# Patient Record
Sex: Male | Born: 1955 | Race: White | Hispanic: No | Marital: Married | Smoking: Former smoker
Health system: Southern US, Community
[De-identification: ages and names within clinical notes are randomized; demographics above are authoritative.]

## PROBLEM LIST (undated history)

## (undated) DIAGNOSIS — N400 Enlarged prostate without lower urinary tract symptoms: Secondary | ICD-10-CM

## (undated) DIAGNOSIS — M1990 Unspecified osteoarthritis, unspecified site: Secondary | ICD-10-CM

## (undated) DIAGNOSIS — I10 Essential (primary) hypertension: Secondary | ICD-10-CM

## (undated) HISTORY — DX: Essential (primary) hypertension: I10

## (undated) HISTORY — DX: Benign prostatic hyperplasia without lower urinary tract symptoms: N40.0

## (undated) HISTORY — DX: Unspecified osteoarthritis, unspecified site: M19.90

---

## 1989-10-16 HISTORY — PX: BACK SURGERY: SHX140

## 2013-10-16 HISTORY — PX: COLONOSCOPY: SHX174

## 2013-10-16 HISTORY — PX: POLYPECTOMY: SHX149

## 2019-10-13 ENCOUNTER — Other Ambulatory Visit: Payer: Self-pay

## 2019-10-13 NOTE — Progress Notes (Signed)
New Patient Office Visit  Assessment & Plan:  1. Well adult exam - Preventive care education provided.  Patient declined influenza, pneumonia, and shingles vaccines.  Also declines HIV screening.  Tdap is up-to-date.  Patient reports he is due for a colonoscopy as he was told to return in 3 years and it has been around 7 years.  States if he remembers correctly they removed 21 polyps.  He does not want me to place a referral at this time but to wait until I get his records. - CMP14+EGFR - Lipid Panel - PSA, total and free - HCV Antibody RFX to Quant PCR - HgbA1C  2. Essential hypertension - Blood pressure is elevated today.  Patient reports he has always been told to take 30 mg of lisinopril but only takes 20 mg of lisinopril as he does not feel like taking the time to cut one in half.  Patient also reports he has a tickle in his throat that he has had for a very long time.  His mother had the same reaction and when they took her off lisinopril it resolved.  He is not actually coughing.  I am going to switch him from lisinopril to losartan 50 mg once daily.  Encourage patient to keep a log of his blood pressure at home and bring it with him to his next appointment. - losartan (COZAAR) 50 MG tablet; Take 1 tablet (50 mg total) by mouth daily.  Dispense: 30 tablet; Refill: 2 - CMP14+EGFR - Lipid Panel  3. Chronic left shoulder pain - Exercises provided for his shoulder.  Encourage use of muscle rubs and Tylenol/NSAIDs.  Patient does not like to take medication.  4. Prediabetes - HgbA1C  5. Urinary frequency - PSA, total and free  6. Prostate cancer screening - PSA, total and free  7. Encounter for hepatitis C screening test for low risk patient - HCV Antibody RFX to Quant PCR   Follow-up: Return in about 6 weeks (around 11/25/2019) for HTN, shoulder pain.   Hendricks Limes, MSN, APRN, FNP-C [facility]  Subjective:  Patient ID: Dakota Cooke, male    DOB:  [birth year]  Age: 63 y.o. MRN: [id number]  Patient Care Team: Loman Brooklyn, FNP as PCP - General (Family Medicine)  CC:  Chief Complaint  Patient presents with  . New Patient (Initial Visit)    [place]  . Establish Care  . Shoulder Pain    Left - from a fall x 4 months ago    HPI Dakota Cooke presents to establish care. Patient is transferring from [place]. A record release has been signed but they have not yet been received.   Hypertension: Patient here for follow-up of elevated blood pressure. He is not exercising and is adherent to low salt diet.  Blood pressure is not well controlled at home. Cardiac symptoms none. Patient denies chest pain, dyspnea, irregular heart beat, near-syncope, palpitations and syncope.  Cardiovascular risk factors: advanced age (older than 16 for men, 62 for women), hypertension, male gender, obesity (BMI >= 30 kg/m2) and sedentary lifestyle. Use of agents associated with hypertension: none. History of target organ damage: none.  Shoulder Pain: Patient complaints of left shoulder pain. This is evaluated as a personal injury. The pain is described as sharp.  The onset of the pain was 4 months ago.  The pain occurs at night/ wakes from sleep at night.  Location is anterior. No history of dislocation. Symptoms are aggravated  by lifting or sleeping on unaffected side. Symptoms are diminished by  avoiding the painful activities and changing positions.  Heat makes the pain worse. He doesn't leave ice on long enough.      Review of Systems  Constitutional: Negative for chills, fever, malaise/fatigue and weight loss.  HENT: Positive for hearing loss. Negative for congestion, ear discharge, ear pain, nosebleeds, sinus pain, sore throat and tinnitus.   Eyes: Negative for blurred vision, double vision, pain, discharge and redness.  Respiratory: Negative for cough, shortness of breath and wheezing.   Cardiovascular: Negative for chest pain,  palpitations and leg swelling.  Gastrointestinal: Negative for abdominal pain, constipation, diarrhea, heartburn, nausea and vomiting.  Genitourinary: Negative for dysuria, frequency and urgency.       Denies trouble initiating a urine stream, weak stream, split stream, and dribbling.  Musculoskeletal: Negative for myalgias.  Skin: Negative for rash.  Neurological: Negative for dizziness, seizures, weakness and headaches.  Psychiatric/Behavioral: Negative for depression, substance abuse and suicidal ideas. The patient is not nervous/anxious.     Current Outpatient Medications:  .  aspirin 81 MG EC tablet, Take 81 mg by mouth daily. Swallow whole., Disp: , Rfl:  .  atorvastatin (LIPITOR) 10 MG tablet, Take 1 tablet (10 mg total) by mouth daily at 6 PM., Disp: 30 tablet, Rfl: 2 .  losartan (COZAAR) 50 MG tablet, Take 1 tablet (50 mg total) by mouth daily., Disp: 30 tablet, Rfl: 2  No Known Allergies  Past Medical History:  Diagnosis Date  . Hyperlipidemia 10/15/2019  . Hypertension     Past Surgical History:  Procedure Laterality Date  . BACK SURGERY  1991    Family History  Problem Relation Age of Onset  . Hypertension Mother   . Kidney cancer Mother   . Parkinson's disease Father   . Breast cancer Sister 37  . Aneurysm Maternal Grandmother        brain  . Hypertension Son     Social History   Socioeconomic History  . Marital status: Married    Spouse name: Not on file  . Number of children: Not on file  . Years of education: Not on file  . Highest education level: Not on file  Occupational History  . Not on file  Tobacco Use  . Smoking status: Former Smoker    Types: Cigarettes    Quit date: 1989    Years since quitting: 32.0  . Smokeless tobacco: Never Used  Substance and Sexual Activity  . Alcohol use: Yes    Comment: occ  . Drug use: Never  . Sexual activity: Not on file  Other Topics Concern  . Not on file  Social History Narrative  . Not on file    Social Determinants of Health   Financial Resource Strain:   . Difficulty of Paying Living Expenses: Not on file  Food Insecurity:   . Worried About Charity fundraiser in the Last Year: Not on file  . Ran Out of Food in the Last Year: Not on file  Transportation Needs:   . Lack of Transportation (Medical): Not on file  . Lack of Transportation (Non-Medical): Not on file  Physical Activity:   . Days of Exercise per Week: Not on file  . Minutes of Exercise per Session: Not on file  Stress:   . Feeling of Stress : Not on file  Social Connections:   . Frequency of Communication with Friends and Family: Not on file  . Frequency  of Social Gatherings with Friends and Family: Not on file  . Attends Religious Services: Not on file  . Active Member of Clubs or Organizations: Not on file  . Attends Archivist Meetings: Not on file  . Marital Status: Not on file  Intimate Partner Violence:   . Fear of Current or Ex-Partner: Not on file  . Emotionally Abused: Not on file  . Physically Abused: Not on file  . Sexually Abused: Not on file    Objective:   Today's Vitals: BP (!) 164/98   Pulse 67   Temp 98.6 F (37 C) (Temporal)   Ht '5\' 8"'$  (1.727 m)   Wt 204 lb 6.4 oz (92.7 kg)   SpO2 97%   BMI 31.08 kg/m   Physical Exam Vitals reviewed.  Constitutional:      General: He is not in acute distress.    Appearance: Normal appearance. He is obese. He is not ill-appearing, toxic-appearing or diaphoretic.  HENT:     Head: Normocephalic and atraumatic.     Right Ear: Tympanic membrane, ear canal and external ear normal. There is no impacted cerumen.     Left Ear: Tympanic membrane, ear canal and external ear normal. There is no impacted cerumen.     Nose: Nose normal. No congestion or rhinorrhea.     Mouth/Throat:     Mouth: Mucous membranes are moist.     Pharynx: Oropharynx is clear. No oropharyngeal exudate or posterior oropharyngeal erythema.  Eyes:     General: No  scleral icterus.       Right eye: No discharge.        Left eye: No discharge.     Conjunctiva/sclera: Conjunctivae normal.     Pupils: Pupils are equal, round, and reactive to light.  Cardiovascular:     Rate and Rhythm: Normal rate and regular rhythm.     Heart sounds: Normal heart sounds. No murmur. No friction rub. No gallop.   Pulmonary:     Effort: Pulmonary effort is normal. No respiratory distress.     Breath sounds: Normal breath sounds. No stridor. No wheezing, rhonchi or rales.  Abdominal:     General: Abdomen is flat. Bowel sounds are normal. There is no distension.     Palpations: Abdomen is soft. There is no mass.     Tenderness: There is no abdominal tenderness. There is no guarding or rebound.     Hernia: No hernia is present.  Musculoskeletal:     Left shoulder: Tenderness present. No swelling, deformity, effusion, laceration, bony tenderness or crepitus. Decreased range of motion. Normal strength. Normal pulse.     Cervical back: Normal range of motion and neck supple. No rigidity. No muscular tenderness.     Right lower leg: No edema.     Left lower leg: No edema.     Comments: Normal empty can test and cross arm test. Pain/LROM with Apley's scratch test.  Lymphadenopathy:     Cervical: No cervical adenopathy.  Skin:    General: Skin is warm and dry.     Capillary Refill: Capillary refill takes less than 2 seconds.  Neurological:     General: No focal deficit present.     Mental Status: He is alert and oriented to person, place, and time. Mental status is at baseline.  Psychiatric:        Mood and Affect: Mood normal.        Behavior: Behavior normal.  Thought Content: Thought content normal.        Judgment: Judgment normal.

## 2019-10-14 ENCOUNTER — Encounter: Payer: Self-pay | Admitting: Family Medicine

## 2019-10-14 ENCOUNTER — Ambulatory Visit (INDEPENDENT_AMBULATORY_CARE_PROVIDER_SITE_OTHER): Payer: Medicare Other | Admitting: Family Medicine

## 2019-10-14 VITALS — BP 164/98 | HR 67 | Temp 98.6°F | Ht 68.0 in | Wt 204.4 lb

## 2019-10-14 DIAGNOSIS — Z0000 Encounter for general adult medical examination without abnormal findings: Secondary | ICD-10-CM

## 2019-10-14 DIAGNOSIS — R350 Frequency of micturition: Secondary | ICD-10-CM | POA: Diagnosis not present

## 2019-10-14 DIAGNOSIS — Z029 Encounter for administrative examinations, unspecified: Secondary | ICD-10-CM

## 2019-10-14 DIAGNOSIS — Z125 Encounter for screening for malignant neoplasm of prostate: Secondary | ICD-10-CM

## 2019-10-14 DIAGNOSIS — I10 Essential (primary) hypertension: Secondary | ICD-10-CM

## 2019-10-14 DIAGNOSIS — M25512 Pain in left shoulder: Secondary | ICD-10-CM

## 2019-10-14 DIAGNOSIS — R7303 Prediabetes: Secondary | ICD-10-CM

## 2019-10-14 DIAGNOSIS — G8929 Other chronic pain: Secondary | ICD-10-CM

## 2019-10-14 DIAGNOSIS — Z1159 Encounter for screening for other viral diseases: Secondary | ICD-10-CM

## 2019-10-14 LAB — BAYER DCA HB A1C WAIVED: HB A1C (BAYER DCA - WAIVED): 5.7 % (ref <7.0)

## 2019-10-14 MED ORDER — LOSARTAN POTASSIUM 50 MG PO TABS: 50.0000 mg | ORAL_TABLET | Freq: Every day | ORAL | 2 refills | Status: DC

## 2019-10-14 NOTE — Patient Instructions (Addendum)
Shoulder Range of Motion Exercises Shoulder range of motion (ROM) exercises are done to keep the shoulder moving freely or to increase movement. They are often recommended for people who have shoulder pain or stiffness or who are recovering from a shoulder surgery. Phase 1 exercises When you are able, do this exercise 1-2 times per day for 30-60 seconds in each direction, or as directed by your health care provider. Pendulum exercise To do this exercise while sitting: 1. Sit in a chair or at the edge of your bed with your feet flat on the floor. 2. Let your affected arm hang down in front of you over the edge of the bed or chair. 3. Relax your shoulder, arm, and hand. [place] your body so your arm gently swings in small circles. You can also use your unaffected arm to start the motion. 5. Repeat changing the direction of the circles, swinging your arm left and right, and swinging your arm forward and back. To do this exercise while standing: 1. Stand next to a sturdy chair or table, and hold on to it with your hand on your unaffected side. 2. Bend forward at the waist. 3. Bend your knees slightly. 4. Relax your shoulder, arm, and hand. 5. While keeping your shoulder relaxed, use body motion to swing your arm in small circles. 6. Repeat changing the direction of the circles, swinging your arm left and right, and swinging your arm forward and back. 7. Between exercises, stand up tall and take a short break to relax your lower back.  Phase 2 exercises Do these exercises 1-2 times per day or as told by your health care provider. Hold each stretch for 30 seconds, and repeat 3 times. Do the exercises with one or both arms as instructed by your health care provider. For these exercises, sit at a table with your hand and arm supported by the table. A chair that slides easily or has wheels can be helpful. External rotation 1. Turn your chair so that your affected side is nearest to the  table. 2. Place your forearm on the table to your side. Bend your elbow about 90 at the elbow (right angle) and place your hand palm facing down on the table. Your elbow should be about 6 inches away from your side. 3. Keeping your arm on the table, lean your body forward. Abduction 1. Turn your chair so that your affected side is nearest to the table. 2. Place your forearm and hand on the table so that your thumb points toward the ceiling and your arm is straight out to your side. 3. Slide your hand out to the side and away from you, using your unaffected arm to do the work. 4. To increase the stretch, you can slide your chair away from the table. Flexion: forward stretch 1. Sit facing the table. Place your hand and elbow on the table in front of you. 2. Slide your hand forward and away from you, using your unaffected arm to do the work. 3. To increase the stretch, you can slide your chair backward. Phase 3 exercises Do these exercises 1-2 times per day or as told by your health care provider. Hold each stretch for 30 seconds, and repeat 3 times. Do the exercises with one or both arms as instructed by your health care provider. Cross-body stretch: posterior capsule stretch 1. Lift your arm straight out in front of you. 2. Bend your arm 90 at the elbow (right angle) so your forearm  moves across your body. 3. Use your other arm to gently pull the elbow across your body, toward your other shoulder. Wall climbs 1. Stand with your affected arm extended out to the side with your hand resting on a door frame. 2. Slide your hand slowly up the door frame. 3. To increase the stretch, step through the door frame. Keep your body upright and do not lean. Wand exercises You will need a cane, a piece of PVC pipe, or a sturdy wooden dowel for wand exercises. Flexion To do this exercise while standing: 1. Hold the wand with both of your hands, palms down. 2. Using the other arm to help, lift your arms  up and over your head, if able. 3. Push upward with your other arm to gently increase the stretch. To do this exercise while lying down: 1. Lie on your back with your elbows resting on the floor and the wand in both your hands. Your hands will be palm down, or pointing toward your feet. 2. Lift your hands toward the ceiling, using your unaffected arm to help if needed. 3. Bring your arms overhead as able, using your unaffected arm to help if needed. Internal rotation 1. Stand while holding the wand behind you with both hands. Your unaffected arm should be extended above your head with the arm of the affected side extended behind you at the level of your waist. The wand should be pointing straight up and down as you hold it. 2. Slowly pull the wand up behind your back by straightening the elbow of your unaffected arm and bending the elbow of your affected arm. External rotation 1. Lie on your back with your affected upper arm supported on a small pillow or rolled towel. When you first do this exercise, keep your upper arm close to your body. Over time, bring your arm up to a 90 angle out to the side. 2. Hold the wand across your stomach and with both hands palm up. Your elbow on your affected side should be bent at a 90 angle. 3. Use your unaffected side to help push your forearm away from you and toward the floor. Keep your elbow on your affected side bent at a 90 angle. Contact a health care provider if you have:  New or increasing pain.  New numbness, tingling, weakness, or discoloration in your arm or hand. This information is not intended to replace advice given to you by your health care provider. Make sure you discuss any questions you have with your health care provider. Document Released: 07/01/2003 Document Revised: 11/14/2017 Document Reviewed: 11/14/2017 Elsevier Patient Education  2020 [employer] 65 Years and Older, Male Preventive care refers to lifestyle  choices and visits with your health care provider that can promote health and wellness. This includes:  A yearly physical exam. This is also called an annual well check.  Regular dental and eye exams.  Immunizations.  Screening for certain conditions.  Healthy lifestyle choices, such as diet and exercise. What can I expect for my preventive care visit? Physical exam Your health care provider will check:  Height and weight. These may be used to calculate body mass index (BMI), which is a measurement that tells if you are at a healthy weight.  Heart rate and blood pressure.  Your skin for abnormal spots. Counseling Your health care provider may ask you questions about:  Alcohol, tobacco, and drug use.  Emotional well-being.  Home and relationship well-being.  Sexual activity.  Eating habits.  History of falls.  Memory and ability to understand (cognition).  Work and work Statistician. What immunizations do I need?  Influenza (flu) vaccine  This is recommended every year. Tetanus, diphtheria, and pertussis (Tdap) vaccine  You may need a Td booster every 10 years. Varicella (chickenpox) vaccine  You may need this vaccine if you have not already been vaccinated. Zoster (shingles) vaccine  You may need this after age 23. Pneumococcal conjugate (PCV13) vaccine  One dose is recommended after age 10. Pneumococcal polysaccharide (PPSV23) vaccine  One dose is recommended after age 1. Measles, mumps, and rubella (MMR) vaccine  You may need at least one dose of MMR if you were born in 1957 or later. You may also need a second dose. Meningococcal conjugate (MenACWY) vaccine  You may need this if you have certain conditions. Hepatitis A vaccine  You may need this if you have certain conditions or if you travel or work in places where you may be exposed to hepatitis A. Hepatitis B vaccine  You may need this if you have certain conditions or if you travel or work  in places where you may be exposed to hepatitis B. Haemophilus influenzae type b (Hib) vaccine  You may need this if you have certain conditions. You may receive vaccines as individual doses or as more than one vaccine together in one shot (combination vaccines). Talk with your health care provider about the risks and benefits of combination vaccines. What tests do I need? Blood tests  Lipid and cholesterol levels. These may be checked every 5 years, or more frequently depending on your overall health.  Hepatitis C test.  Hepatitis B test. Screening  Lung cancer screening. You may have this screening every year starting at age 8 if you have a 30-pack-year history of smoking and currently smoke or have quit within the past 15 years.  Colorectal cancer screening. All adults should have this screening starting at age 11 and continuing until age 29. Your health care provider may recommend screening at age 28 if you are at increased risk. You will have tests every 1-10 years, depending on your results and the type of screening test.  Prostate cancer screening. Recommendations will vary depending on your family history and other risks.  Diabetes screening. This is done by checking your blood sugar (glucose) after you have not eaten for a while (fasting). You may have this done every 1-3 years.  Abdominal aortic aneurysm (AAA) screening. You may need this if you are a current or former smoker.  Sexually transmitted disease (STD) testing. Follow these instructions at home: Eating and drinking  Eat a diet that includes fresh fruits and vegetables, whole grains, lean protein, and low-fat dairy products. Limit your intake of foods with high amounts of sugar, saturated fats, and salt.  Take vitamin and mineral supplements as recommended by your health care provider.  Do not drink alcohol if your health care provider tells you not to drink.  If you drink alcohol: ? Limit how much you have to  0-2 drinks a day. ? Be aware of how much alcohol is in your drink. In the [place] one drink equals one 12 oz bottle of beer (355 mL), one 5 oz glass of wine (148 mL), or one 1 oz glass of hard liquor (44 mL). Lifestyle  Take daily care of your teeth and gums.  Stay active. Exercise for at least 30 minutes on 5 or more days each week.  Do not use any products that contain nicotine or tobacco, such as cigarettes, e-cigarettes, and chewing tobacco. If you need help quitting, ask your health care provider.  If you are sexually active, practice safe sex. Use a condom or other form of protection to prevent STIs (sexually transmitted infections).  Talk with your health care provider about taking a low-dose aspirin or statin. What's next?  Visit your health care provider once a year for a well check visit.  Ask your health care provider how often you should have your eyes and teeth checked.  Stay up to date on all vaccines. This information is not intended to replace advice given to you by your health care provider. Make sure you discuss any questions you have with your health care provider. Document Released: 10/29/2015 Document Revised: 09/26/2018 Document Reviewed: 09/26/2018 Elsevier Patient Education  2020 [employer].

## 2019-10-15 ENCOUNTER — Other Ambulatory Visit: Payer: Self-pay | Admitting: Family Medicine

## 2019-10-15 ENCOUNTER — Encounter: Payer: Self-pay | Admitting: Family Medicine

## 2019-10-15 DIAGNOSIS — E785 Hyperlipidemia, unspecified: Secondary | ICD-10-CM | POA: Insufficient documentation

## 2019-10-15 DIAGNOSIS — E782 Mixed hyperlipidemia: Secondary | ICD-10-CM

## 2019-10-15 HISTORY — DX: Hyperlipidemia, unspecified: E78.5

## 2019-10-15 LAB — CMP14+EGFR
ALT: 33 IU/L (ref 0–44)
AST: 29 IU/L (ref 0–40)
Albumin/Globulin Ratio: 2.3 — ABNORMAL HIGH (ref 1.2–2.2)
Albumin: 5 g/dL — ABNORMAL HIGH (ref 3.8–4.8)
Alkaline Phosphatase: 67 IU/L (ref 39–117)
BUN/Creatinine Ratio: 15 (ref 10–24)
BUN: 15 mg/dL (ref 8–27)
Bilirubin Total: 0.6 mg/dL (ref 0.0–1.2)
CO2: 22 mmol/L (ref 20–29)
Calcium: 9.6 mg/dL (ref 8.6–10.2)
Chloride: 103 mmol/L (ref 96–106)
Creatinine, Ser: 1 mg/dL (ref 0.76–1.27)
GFR calc Af Amer: 91 mL/min/{1.73_m2} (ref >59)
GFR calc non Af Amer: 79 mL/min/{1.73_m2} (ref >59)
Globulin, Total: 2.2 g/dL (ref 1.5–4.5)
Glucose: 102 mg/dL — ABNORMAL HIGH (ref 65–99)
Potassium: 4.6 mmol/L (ref 3.5–5.2)
Sodium: 139 mmol/L (ref 134–144)
Total Protein: 7.2 g/dL (ref 6.0–8.5)

## 2019-10-15 LAB — LIPID PANEL
Chol/HDL Ratio: 6.3 ratio — ABNORMAL HIGH (ref 0.0–5.0)
Cholesterol, Total: 214 mg/dL — ABNORMAL HIGH (ref 100–199)
HDL: 34 mg/dL — ABNORMAL LOW (ref >39)
LDL Chol Calc (NIH): 133 mg/dL — ABNORMAL HIGH (ref 0–99)
Triglycerides: 263 mg/dL — ABNORMAL HIGH (ref 0–149)
VLDL Cholesterol Cal: 47 mg/dL — ABNORMAL HIGH (ref 5–40)

## 2019-10-15 LAB — HEPATITIS C ANTIBODY: Hep C Virus Ab: <0.1 s/co ratio (ref 0.0–0.9)

## 2019-10-15 LAB — PSA, TOTAL AND FREE
PSA, Free Pct: 21.3 %
PSA, Free: 0.32 ng/mL
Prostate Specific Ag, Serum: 1.5 ng/mL (ref 0.0–4.0)

## 2019-10-15 MED ORDER — ATORVASTATIN CALCIUM 10 MG PO TABS: 10.0000 mg | ORAL_TABLET | Freq: Every day | ORAL | 2 refills | Status: DC

## 2019-11-01 ENCOUNTER — Encounter: Payer: Self-pay | Admitting: Family Medicine

## 2019-11-17 DIAGNOSIS — Z8616 Personal history of COVID-19: Secondary | ICD-10-CM

## 2019-11-17 HISTORY — DX: Personal history of COVID-19: Z86.16

## 2019-11-18 ENCOUNTER — Ambulatory Visit: Payer: Medicare HMO | Attending: Internal Medicine

## 2019-11-18 DIAGNOSIS — Z20822 Contact with and (suspected) exposure to covid-19: Secondary | ICD-10-CM

## 2019-11-19 ENCOUNTER — Telehealth: Payer: Self-pay | Admitting: *Deleted

## 2019-11-19 LAB — NOVEL CORONAVIRUS, NAA: SARS-CoV-2, NAA: NOT DETECTED

## 2019-11-19 NOTE — Telephone Encounter (Signed)
Reviewed negative Covid 19 results with the patient and his wife. He will isolate along side her as she is positive for Covid 19.

## 2019-11-26 ENCOUNTER — Ambulatory Visit: Payer: Medicare Other | Admitting: Family Medicine

## 2019-11-29 ENCOUNTER — Inpatient Hospital Stay (HOSPITAL_COMMUNITY)
Admission: EM | Admit: 2019-11-29 | Discharge: 2019-12-08 | DRG: 177 | Disposition: A | Discharge status: Home / Self Care | Payer: Medicare HMO | Attending: Internal Medicine | Admitting: Internal Medicine

## 2019-11-29 ENCOUNTER — Emergency Department (HOSPITAL_COMMUNITY): Payer: Medicare HMO

## 2019-11-29 ENCOUNTER — Encounter (HOSPITAL_COMMUNITY): Payer: Self-pay | Admitting: Emergency Medicine

## 2019-11-29 ENCOUNTER — Other Ambulatory Visit: Payer: Self-pay

## 2019-11-29 DIAGNOSIS — E669 Obesity, unspecified: Secondary | ICD-10-CM | POA: Diagnosis present

## 2019-11-29 DIAGNOSIS — Z6831 Body mass index (BMI) 31.0-31.9, adult: Secondary | ICD-10-CM | POA: Diagnosis not present

## 2019-11-29 DIAGNOSIS — J069 Acute upper respiratory infection, unspecified: Secondary | ICD-10-CM

## 2019-11-29 DIAGNOSIS — N401 Enlarged prostate with lower urinary tract symptoms: Secondary | ICD-10-CM | POA: Diagnosis not present

## 2019-11-29 DIAGNOSIS — N400 Enlarged prostate without lower urinary tract symptoms: Secondary | ICD-10-CM | POA: Diagnosis present

## 2019-11-29 DIAGNOSIS — D72829 Elevated white blood cell count, unspecified: Secondary | ICD-10-CM | POA: Diagnosis not present

## 2019-11-29 DIAGNOSIS — R350 Frequency of micturition: Secondary | ICD-10-CM | POA: Diagnosis not present

## 2019-11-29 DIAGNOSIS — R7303 Prediabetes: Secondary | ICD-10-CM | POA: Diagnosis present

## 2019-11-29 DIAGNOSIS — Z8051 Family history of malignant neoplasm of kidney: Secondary | ICD-10-CM

## 2019-11-29 DIAGNOSIS — E785 Hyperlipidemia, unspecified: Secondary | ICD-10-CM | POA: Diagnosis not present

## 2019-11-29 DIAGNOSIS — J9601 Acute respiratory failure with hypoxia: Secondary | ICD-10-CM

## 2019-11-29 DIAGNOSIS — J1282 Pneumonia due to coronavirus disease 2019: Secondary | ICD-10-CM | POA: Diagnosis present

## 2019-11-29 DIAGNOSIS — F3289 Other specified depressive episodes: Secondary | ICD-10-CM | POA: Diagnosis present

## 2019-11-29 DIAGNOSIS — R918 Other nonspecific abnormal finding of lung field: Secondary | ICD-10-CM | POA: Diagnosis not present

## 2019-11-29 DIAGNOSIS — I10 Essential (primary) hypertension: Secondary | ICD-10-CM | POA: Diagnosis not present

## 2019-11-29 DIAGNOSIS — Z820 Family history of epilepsy and other diseases of the nervous system: Secondary | ICD-10-CM | POA: Diagnosis not present

## 2019-11-29 DIAGNOSIS — J189 Pneumonia, unspecified organism: Secondary | ICD-10-CM

## 2019-11-29 DIAGNOSIS — Z981 Arthrodesis status: Secondary | ICD-10-CM

## 2019-11-29 DIAGNOSIS — Z803 Family history of malignant neoplasm of breast: Secondary | ICD-10-CM | POA: Diagnosis not present

## 2019-11-29 DIAGNOSIS — R7401 Elevation of levels of liver transaminase levels: Secondary | ICD-10-CM | POA: Diagnosis present

## 2019-11-29 DIAGNOSIS — U071 COVID-19: Secondary | ICD-10-CM | POA: Diagnosis not present

## 2019-11-29 DIAGNOSIS — N179 Acute kidney failure, unspecified: Secondary | ICD-10-CM | POA: Diagnosis not present

## 2019-11-29 DIAGNOSIS — T380X5A Adverse effect of glucocorticoids and synthetic analogues, initial encounter: Secondary | ICD-10-CM | POA: Diagnosis not present

## 2019-11-29 DIAGNOSIS — R0602 Shortness of breath: Secondary | ICD-10-CM | POA: Diagnosis not present

## 2019-11-29 LAB — RESPIRATORY PANEL BY RT PCR (FLU A&B, COVID)
Influenza A by PCR: NEGATIVE
Influenza B by PCR: NEGATIVE
SARS Coronavirus 2 by RT PCR: POSITIVE — AB

## 2019-11-29 LAB — COMPREHENSIVE METABOLIC PANEL
ALT: 88 U/L — ABNORMAL HIGH (ref 0–44)
AST: 69 U/L — ABNORMAL HIGH (ref 15–41)
Albumin: 3.6 g/dL (ref 3.5–5.0)
Alkaline Phosphatase: 45 U/L (ref 38–126)
Anion gap: 14 (ref 5–15)
BUN: 14 mg/dL (ref 8–23)
CO2: 25 mmol/L (ref 22–32)
Calcium: 8.6 mg/dL — ABNORMAL LOW (ref 8.9–10.3)
Chloride: 95 mmol/L — ABNORMAL LOW (ref 98–111)
Creatinine, Ser: 0.96 mg/dL (ref 0.61–1.24)
GFR calc Af Amer: >60 mL/min (ref >60)
GFR calc non Af Amer: >60 mL/min (ref >60)
Glucose, Bld: 127 mg/dL — ABNORMAL HIGH (ref 70–99)
Potassium: 3.4 mmol/L — ABNORMAL LOW (ref 3.5–5.1)
Sodium: 134 mmol/L — ABNORMAL LOW (ref 135–145)
Total Bilirubin: 1.2 mg/dL (ref 0.3–1.2)
Total Protein: 6.9 g/dL (ref 6.5–8.1)

## 2019-11-29 LAB — CBC WITH DIFFERENTIAL/PLATELET
Abs Immature Granulocytes: 0.03 10*3/uL (ref 0.00–0.07)
Basophils Absolute: 0 10*3/uL (ref 0.0–0.1)
Basophils Relative: 0 %
Eosinophils Absolute: 0 10*3/uL (ref 0.0–0.5)
Eosinophils Relative: 0 %
HCT: 45.1 % (ref 39.0–52.0)
Hemoglobin: 15.3 g/dL (ref 13.0–17.0)
Immature Granulocytes: 0 %
Lymphocytes Relative: 8 %
Lymphs Abs: 0.6 10*3/uL — ABNORMAL LOW (ref 0.7–4.0)
MCH: 29.5 pg (ref 26.0–34.0)
MCHC: 33.9 g/dL (ref 30.0–36.0)
MCV: 87.1 fL (ref 80.0–100.0)
Monocytes Absolute: 0.5 10*3/uL (ref 0.1–1.0)
Monocytes Relative: 6 %
Neutro Abs: 6.7 10*3/uL (ref 1.7–7.7)
Neutrophils Relative %: 86 %
Platelets: 164 10*3/uL (ref 150–400)
RBC: 5.18 MIL/uL (ref 4.22–5.81)
RDW: 12 % (ref 11.5–15.5)
WBC: 7.8 10*3/uL (ref 4.0–10.5)
nRBC: 0 % (ref 0.0–0.2)

## 2019-11-29 LAB — C-REACTIVE PROTEIN: CRP: 11.3 mg/dL — ABNORMAL HIGH (ref <1.0)

## 2019-11-29 LAB — TRIGLYCERIDES: Triglycerides: 72 mg/dL (ref <150)

## 2019-11-29 LAB — FIBRINOGEN: Fibrinogen: 781 mg/dL — ABNORMAL HIGH (ref 210–475)

## 2019-11-29 LAB — D-DIMER, QUANTITATIVE: D-Dimer, Quant: 1.15 ug/mL-FEU — ABNORMAL HIGH (ref 0.00–0.50)

## 2019-11-29 LAB — FERRITIN: Ferritin: 1351 ng/mL — ABNORMAL HIGH (ref 24–336)

## 2019-11-29 LAB — LACTATE DEHYDROGENASE: LDH: 262 U/L — ABNORMAL HIGH (ref 98–192)

## 2019-11-29 LAB — PROCALCITONIN: Procalcitonin: <0.1 ng/mL

## 2019-11-29 LAB — LACTIC ACID, PLASMA: Lactic Acid, Venous: 1.4 mmol/L (ref 0.5–1.9)

## 2019-11-29 MED ORDER — GUAIFENESIN-DM 100-10 MG/5ML PO SYRP
10.0000 mL | ORAL_SOLUTION | ORAL | Status: DC | PRN
  Filled 2019-11-29: qty 10

## 2019-11-29 MED ORDER — DEXAMETHASONE SODIUM PHOSPHATE 10 MG/ML IJ SOLN
6.0000 mg | INTRAMUSCULAR | Status: AC
  Administered 2019-11-29 – 2019-12-08 (×10): 6 mg via INTRAVENOUS
  Filled 2019-11-29 (×10): qty 1

## 2019-11-29 MED ORDER — ATORVASTATIN CALCIUM 10 MG PO TABS
10.0000 mg | ORAL_TABLET | Freq: Every day | ORAL | Status: DC
  Administered 2019-11-29 – 2019-12-02 (×4): 10 mg via ORAL
  Filled 2019-11-29 (×4): qty 1

## 2019-11-29 MED ORDER — BISACODYL 5 MG PO TBEC: 5.0000 mg | DELAYED_RELEASE_TABLET | Freq: Every day | ORAL | Status: DC | PRN

## 2019-11-29 MED ORDER — OXYCODONE HCL 5 MG PO TABS: 5.0000 mg | ORAL_TABLET | ORAL | Status: DC | PRN

## 2019-11-29 MED ORDER — SODIUM CHLORIDE 0.9 % IV BOLUS
1000.0000 mL | Freq: Once | INTRAVENOUS | Status: AC
  Administered 2019-11-29: 1000 mL via INTRAVENOUS

## 2019-11-29 MED ORDER — SODIUM CHLORIDE 0.9 % IV SOLN
100.0000 mg | Freq: Every day | INTRAVENOUS | Status: AC
  Administered 2019-11-30 – 2019-12-03 (×4): 100 mg via INTRAVENOUS
  Filled 2019-11-29 (×4): qty 20

## 2019-11-29 MED ORDER — ALBUTEROL SULFATE HFA 108 (90 BASE) MCG/ACT IN AERS
2.0000 | INHALATION_SPRAY | RESPIRATORY_TRACT | Status: DC | PRN
  Administered 2019-12-07: 2 via RESPIRATORY_TRACT
  Filled 2019-11-29: qty 6.7

## 2019-11-29 MED ORDER — SODIUM CHLORIDE 0.9% FLUSH: 3.0000 mL | INTRAVENOUS | Status: DC | PRN

## 2019-11-29 MED ORDER — SODIUM CHLORIDE 0.9% FLUSH
3.0000 mL | Freq: Two times a day (BID) | INTRAVENOUS | Status: DC
  Administered 2019-11-29 – 2019-12-02 (×6): 3 mL via INTRAVENOUS

## 2019-11-29 MED ORDER — ACETAMINOPHEN 325 MG PO TABS
650.0000 mg | ORAL_TABLET | Freq: Four times a day (QID) | ORAL | Status: DC | PRN
  Administered 2019-11-29: 650 mg via ORAL
  Filled 2019-11-29: qty 2

## 2019-11-29 MED ORDER — SODIUM CHLORIDE 0.9 % IV SOLN: 250.0000 mL | INTRAVENOUS | Status: DC | PRN

## 2019-11-29 MED ORDER — ONDANSETRON HCL 4 MG/2ML IJ SOLN: 4.0000 mg | Freq: Four times a day (QID) | INTRAMUSCULAR | Status: DC | PRN

## 2019-11-29 MED ORDER — SODIUM CHLORIDE 0.9 % IV SOLN
200.0000 mg | Freq: Once | INTRAVENOUS | Status: AC
  Administered 2019-11-29: 12:00:00 200 mg via INTRAVENOUS
  Filled 2019-11-29: qty 40

## 2019-11-29 MED ORDER — ACETAMINOPHEN 325 MG PO TABS
650.0000 mg | ORAL_TABLET | Freq: Once | ORAL | Status: AC
  Administered 2019-11-29: 09:00:00 650 mg via ORAL
  Filled 2019-11-29: qty 2

## 2019-11-29 MED ORDER — ASPIRIN 81 MG PO TBEC
81.0000 mg | DELAYED_RELEASE_TABLET | Freq: Every day | ORAL | Status: DC
  Administered 2019-11-30 – 2019-12-08 (×9): 81 mg via ORAL
  Filled 2019-11-29 (×17): qty 1

## 2019-11-29 MED ORDER — ONDANSETRON HCL 4 MG PO TABS: 4.0000 mg | ORAL_TABLET | Freq: Four times a day (QID) | ORAL | Status: DC | PRN

## 2019-11-29 MED ORDER — FLEET ENEMA 7-19 GM/118ML RE ENEM: 1.0000 | ENEMA | Freq: Once | RECTAL | Status: DC | PRN

## 2019-11-29 MED ORDER — TOCILIZUMAB 400 MG/20ML IV SOLN
8.0000 mg/kg | Freq: Once | INTRAVENOUS | Status: AC
  Administered 2019-11-29: 740 mg via INTRAVENOUS
  Filled 2019-11-29: qty 37

## 2019-11-29 MED ORDER — LOSARTAN POTASSIUM 50 MG PO TABS
50.0000 mg | ORAL_TABLET | Freq: Every day | ORAL | Status: DC
  Filled 2019-11-29: qty 1

## 2019-11-29 MED ORDER — ENOXAPARIN SODIUM 40 MG/0.4ML ~~LOC~~ SOLN
40.0000 mg | SUBCUTANEOUS | Status: DC
  Administered 2019-11-29 – 2019-12-08 (×10): 40 mg via SUBCUTANEOUS
  Filled 2019-11-29 (×10): qty 0.4

## 2019-11-29 MED ORDER — HYDROCOD POLST-CPM POLST ER 10-8 MG/5ML PO SUER: 5.0000 mL | Freq: Two times a day (BID) | ORAL | Status: DC | PRN

## 2019-11-29 MED ORDER — POLYETHYLENE GLYCOL 3350 17 G PO PACK: 17.0000 g | PACK | Freq: Every day | ORAL | Status: DC | PRN

## 2019-11-29 NOTE — ED Notes (Signed)
Patients O2 turned up to 3L via n.c. in regards to O2 sat of 91% on 2L.

## 2019-11-29 NOTE — H&P (Signed)
History and Physical    Dakota Cooke [id number] DOB: 1956-04-21 DOA: 11/29/2019  PCP: Loman Brooklyn, FNP Consultants:  None Patient coming from:  Home - lives with his wife; NOK: Wife, Sambo Bassano, [phone]  Chief Complaint: Fatigue  HPI: Dakota Cooke is a 64 y.o. male with medical history significant of HTN; prediabetes; HLD; and BPH presenting with fatigue.  He reports returning home from his mother's funeral in [place] 2 weeks ago; he does not know of anyone there who is sick.  Shortly thereafter, he and his wife started getting sick.  He tested negative for COVID on 2/2 but his wife's test was positive and so he has been in quarantine with her.  He reports inability to eat or drink and severe fatigue for 1-2 weeks.  He has had mild SOB but mostly fatigue is what brought him in.  His wife reports that they buried his mother 2 weeks ago.  He is not usually depressed.  His was having a severe headache and was tested 2 weeks ago.  He has a very low tolerance for pain medication.   ED Course:  COVID infection.  Negative test 10 days ago, has been quarantining with his positive wife.  [place] went [place] for his mother[place] funeral.  Currently on 3L home O2.  Review of Systems: As per HPI; otherwise review of systems reviewed and negative.   Ambulatory Status:  Ambulates without assistance  Past Medical History:  Diagnosis Date  . BPH (benign prostatic hyperplasia)   . Hyperlipidemia 10/15/2019  . Hypertension     Past Surgical History:  Procedure Laterality Date  . BACK SURGERY  1991   lower thoracic spine fusion to L3 level    Social History   Socioeconomic History  . Marital status: Married    Spouse name: Not on file  . Number of children: Not on file  . Years of education: Not on file  . Highest education level: Not on file  Occupational History  . Not on file  Tobacco Use  . Smoking status: Former Smoker    Types: Cigarettes    Quit date: 1989    Years since quitting: 32.1  . Smokeless tobacco: Never Used  Substance and Sexual Activity  . Alcohol use: Yes    Comment: occ  . Drug use: Never  . Sexual activity: Not on file  Other Topics Concern  . Not on file  Social History Narrative  . Not on file   Social Determinants of Health   Financial Resource Strain:   . Difficulty of Paying Living Expenses: Not on file  Food Insecurity:   . Worried About Charity fundraiser in the Last Year: Not on file  . Ran Out of Food in the Last Year: Not on file  Transportation Needs:   . Lack of Transportation (Medical): Not on file  . Lack of Transportation (Non-Medical): Not on file  Physical Activity:   . Days of Exercise per Week: Not on file  . Minutes of Exercise per Session: Not on file  Stress:   . Feeling of Stress : Not on file  Social Connections:   . Frequency of Communication with Friends and Family: Not on file  . Frequency of Social Gatherings with Friends and Family: Not on file  . Attends Religious Services: Not on file  . Active Member of Clubs or Organizations: Not on file  . Attends Archivist Meetings: Not on file  . Marital Status: Not  on file  Intimate Partner Violence:   . Fear of Current or Ex-Partner: Not on file  . Emotionally Abused: Not on file  . Physically Abused: Not on file  . Sexually Abused: Not on file    No Known Allergies  Family History  Problem Relation Age of Onset  . Hypertension Mother   . Kidney cancer Mother   . Parkinson's disease Father   . Breast cancer Sister 75  . Aneurysm Maternal Grandmother        brain  . Hypertension Son     Prior to Admission medications   Medication Sig Start Date End Date Taking? Authorizing Provider  aspirin 81 MG EC tablet Take 81 mg by mouth daily. Swallow whole.   Yes [provider]  atorvastatin (LIPITOR) 10 MG tablet Take 1 tablet (10 mg total) by mouth daily at 6 PM. 10/15/19  Yes Hendricks Limes F, FNP  ibuprofen  (ADVIL) 200 MG tablet Take 200 mg by mouth every 8 (eight) hours as needed for mild pain.   Yes [provider]  losartan (COZAAR) 50 MG tablet Take 1 tablet (50 mg total) by mouth daily. 10/14/19  Yes Hendricks Limes F, FNP    Physical Exam: Vitals:   11/29/19 0700 11/29/19 0715 11/29/19 1000 11/29/19 1015  BP: (!) 131/92 129/81    Pulse: 80 78 62 67  Resp: (!) 36 (!) 38 (!) 32 (!) 33  Temp:      TempSrc:      SpO2: 92% 93% 94% 93%     . General:  Appears calm and comfortable and is NAD . Eyes:  PERRL, EOMI, normal lids, iris . ENT:  grossly normal hearing, lips & tongue, mmm; appropriate dentition . Neck:  no LAD, masses or thyromegaly . Cardiovascular:  RRR, no m/r/g. No LE edema.  Marland Kitchen Respiratory:   Scattered rhonchi.  Mildly to moderately increased respiratory effort with persistent tachypnea . Abdomen:  soft, NT, ND, NABS . Skin:  no rash or induration seen on limited exam . Musculoskeletal:  grossly normal tone BUE/BLE, good ROM, no bony abnormality . Psychiatric:  depressed mood and affect, speech fluent and appropriate, AOx3 . Neurologic:  CN 2-12 grossly intact, moves all extremities in coordinated fashion    Radiological Exams on Admission: DG Chest Port 1 View  Result Date: 11/29/2019 CLINICAL DATA:  Fatigue, shortness of breath and body aches. Positive test for COVID-19 one week ago. EXAM: PORTABLE CHEST 1 VIEW COMPARISON:  None. FINDINGS: The heart is mildly enlarged. Bilateral pulmonary infiltrates present predominantly in the lower lung zones. No overt pulmonary edema, pleural fluid or pneumothorax. IMPRESSION: Bilateral pulmonary infiltrates predominantly in the lower lung zones. Electronically Signed   By: Aletta Edouard M.D.   On: 11/29/2019 08:49    EKG: Independently reviewed.  NSR with rate 82; prolonged QTc 537; no evidence of acute ischemia   Labs on Admission: I have personally reviewed the available labs and imaging studies at the time of the  admission.  Pertinent labs:   Glucose 127 AST 69/ALT 88 LDH 262 Ferritin 1351 CRP 11.3 Lactate 1.4 Procalcitonin <0.10 Normal CBC, +lymphopenia D-dimer 1.15 Fibrinogen 781 COVID POSITIVE A1c 5.7 on 12/29   Assessment/Plan Principal Problem:   Acute respiratory disease due to COVID-19 virus Active Problems:   Hypertension   Hyperlipidemia   BPH (benign prostatic hyperplasia)   Acute respiratory failure with hypoxia due to COVID-19 PNA -Patient with presenting with SOB and severe fatigue -Anorexia noted without the  presence of other GI symptoms -He does not have a usual home O2 requirement and is currently requiring 3L [place] O2 -COVID POSITIVE -The patient has comorbidities which may increase the risk for ARDS/MODS including: age, HTN, pre-DM, obesity -Pertinent labs concerning for COVID include lymphopenia; increased LFTs; increased LDH; markedly elevated D-dimer (>1); markedly increased ferritin; low procalcitonin; markedly elevated CRP (>7); increased fibrinogen -CXR with multifocal opacities which may be c/w COVID vs. Multifocal PNA -Will not treat with broad-spectrum antibiotics given procalcitonin <0.1 -Will admit to [facility] for further evaluation, close monitoring, and treatment -Monitor on telemetry x at least 24 hours -At this time, will attempt to avoid use of aerosolized medications and use HFAs instead -Will check daily labs including BMP with Mag, Phos; LFTs; CBC with differential; CRP; ferritin; fibrinogen; D-dimer -Consider Echo since patient has troponin elevation; will continue to trend HS troponin -Will order steroids and Remdesivir (pharmacy consult) given +COVID test, +CXR, and hypoxia <94% on room air -If the patient shows clinical deterioration, consider transfer to ICU with PCCM consultation -Consider Tocilizumab and/or convalescent plasma if the patient does not stabilize on current treatment or if the patient has marked clinical decompensation; the patient  does not appear to require these treatments at this time. -Will attempt to maintain euvolemia to a net negative fluid status -Will ask the patient to maintain an awake prone position for 16+ hours a day, if possible, with a minimum of 2-3 hours at a time -With D-dimer <5, will use standard-dosed Lovenox for DVT prevention -Patient was seen wearing full PPE including: gown, gloves, head cover, N95, and face shield; donning and doffing was in compliance with current standards.  HTN -Continue Cozaar (and ASA)  HLD -Continue Lipitor  BPH -He reports urinary frequency but does not appear to be taking medications for BPH -He may benefit from outpatient treatment by PCP or urologist  Pre-diabetes -Mildly increased glucose -For now will follow without treatment given reassuring A1c in December -Steroids may worse his hyperglycemia and he may need SSI at that time    DVT prophylaxis:  Lovenox  Code Status:  Full - confirmed with patient Family Communication: None present; I spoke with the patient's wife by telephone. Disposition Plan:  He is anticipated to d/c to home without [facility] services once his respiratory issues have been resolved.  He may require home O2 at the time of discharge. Consults called: None  Admission status: Admit - It is my clinical opinion that admission to INPATIENT is reasonable and necessary because of the expectation that this patient will require hospital care that crosses at least 2 midnights to treat this condition based on the medical complexity of the problems presented.  Given the aforementioned information, the predictability of an adverse outcome is felt to be significant.     Karmen Bongo MD [facility]   How to contact the [facility] Attending or Consulting provider [place] or covering provider during after hours [place], for this patient?  1. Check the care team in [facility] and look for a) attending/consulting [facility] provider listed and b) the [facility] team  listed 2. Log into [email or web address] and use [place] universal password to access. If you do not have the password, please contact the hospital operator. 3. Locate the [facility] provider you are looking for under [facility] and page to a number that you can be directly reached. 4. If you still have difficulty reaching the provider, please page the [facility] (Director on Call) for the Hospitalists  listed on amion for assistance.   11/29/2019, 10:33 AM

## 2019-11-29 NOTE — Plan of Care (Signed)
Patient AOX4, no c/o pain. Bed in lowest position, safety measures in place. Patient orientated to room and educated on laying prone. Will continue to monitor.

## 2019-11-29 NOTE — ED Notes (Signed)
Patient's wife wendy and sister updated on plan of care. pts wife and sister requesting update once patient has an assigned inpatient bed.

## 2019-11-29 NOTE — ED Provider Notes (Signed)
[facility] EMERGENCY DEPARTMENT Provider Note   CSN: [id number] Arrival date & time: 11/29/19  [id number]     History Chief Complaint  Patient presents with  . Fatigue    Dakota Cooke is a 64 y.o. male with past medical history significant for BPH, hyperlipidemia, hypertension presented to emergency department today with chief complaint of fatigue x10 days. Patient was tested for Covid x10 days ago with negative test.  His wife however tested positive.  They just returned from [place] after going to a funeral for his mother.  They drove there.  He endorses decreased appetite and generalized weakness and fatigue.  He has been taking ibuprofen for symptoms at home.  He states he has not eaten much at all in the last week besides sips of water or Jell-O.  He denies any associated fever but admits to chills.  He denies any shortness of breath, congestion, chest pain, abdominal pain, nausea, vomiting, diarrhea. He does admit to urinary frequency but states that is been going on for the last year, no changes, no dysuria or gross hematuria. Patient is not anticoagulated. He is unsure if he has loss of taste or smell because he's feeling so poorly.    Past Medical History:  Diagnosis Date  . BPH (benign prostatic hyperplasia)   . Hyperlipidemia 10/15/2019  . Hypertension     Patient Active Problem List   Diagnosis Date Noted  . Hyperlipidemia 10/15/2019  . Hypertension     Past Surgical History:  Procedure Laterality Date  . BACK SURGERY  1991   lower thoracic spine fusion to L3 level       Family History  Problem Relation Age of Onset  . Hypertension Mother   . Kidney cancer Mother   . Parkinson's disease Father   . Breast cancer Sister 22  . Aneurysm Maternal Grandmother        brain  . Hypertension Son     Social History   Tobacco Use  . Smoking status: Former Smoker    Types: Cigarettes    Quit date: 1989    Years since quitting: 32.1  .  Smokeless tobacco: Never Used  Substance Use Topics  . Alcohol use: Yes    Comment: occ  . Drug use: Never    Home Medications Prior to Admission medications   Medication Sig Start Date End Date Taking? Authorizing Provider  aspirin 81 MG EC tablet Take 81 mg by mouth daily. Swallow whole.   Yes [provider]  atorvastatin (LIPITOR) 10 MG tablet Take 1 tablet (10 mg total) by mouth daily at 6 PM. 10/15/19  Yes Hendricks Limes F, FNP  ibuprofen (ADVIL) 200 MG tablet Take 200 mg by mouth every 8 (eight) hours as needed for mild pain.   Yes [provider]  losartan (COZAAR) 50 MG tablet Take 1 tablet (50 mg total) by mouth daily. 10/14/19  Yes Loman Brooklyn, FNP    Allergies    Patient has no known allergies.  Review of Systems   Review of Systems  All other systems are reviewed and are negative for acute change except as noted in the HPI.   Physical Exam Updated Vital Signs BP (!) 134/93   Pulse 84   Temp 99.4 F (37.4 C) (Oral)   Resp (!) 23   SpO2 (!) 89%   Physical Exam Vitals and nursing note reviewed.  Constitutional:      General: He is not in acute distress.  Appearance: He is not ill-appearing.     Comments: Looks fatigued but non  toxic  HENT:     Head: Normocephalic and atraumatic.     Right Ear: Tympanic membrane and external ear normal.     Left Ear: Tympanic membrane and external ear normal.     Nose: Nose normal.     Mouth/Throat:     Mouth: Mucous membranes are dry.     Pharynx: Oropharynx is clear.  Eyes:     General: No scleral icterus.       Right eye: No discharge.        Left eye: No discharge.     Extraocular Movements: Extraocular movements intact.     Conjunctiva/sclera: Conjunctivae normal.     Pupils: Pupils are equal, round, and reactive to light.  Neck:     Vascular: No JVD.  Cardiovascular:     Rate and Rhythm: Normal rate and regular rhythm.     Pulses: Normal pulses.          Radial pulses are 2+ on the  right side and 2+ on the left side.     Heart sounds: Normal heart sounds.  Pulmonary:     Comments: Hypoxic on room air to 86%. Lungs sounds diminished throughout.  Symmetric chest rise. No wheezing, rales, or rhonchi. Speaking in full sentences, no accessory muscle use. Abdominal:     Comments: Abdomen is soft, non-distended, and non-tender in all quadrants. No rigidity, no guarding. No peritoneal signs.  Musculoskeletal:        General: Normal range of motion.     Cervical back: Normal range of motion.  Skin:    General: Skin is warm and dry.     Capillary Refill: Capillary refill takes less than 2 seconds.  Neurological:     Mental Status: He is oriented to person, place, and time.     GCS: GCS eye subscore is 4. GCS verbal subscore is 5. GCS motor subscore is 6.     Comments: Fluent speech, no facial droop.  Psychiatric:        Behavior: Behavior normal.     ED Results / Procedures / Treatments   Labs (all labs ordered are listed, but only abnormal results are displayed) Labs Reviewed  CBC WITH DIFFERENTIAL/PLATELET - Abnormal; Notable for the following components:      Result Value   Lymphs Abs 0.6 (*)    All other components within normal limits  COMPREHENSIVE METABOLIC PANEL - Abnormal; Notable for the following components:   Sodium 134 (*)    Potassium 3.4 (*)    Chloride 95 (*)    Glucose, Bld 127 (*)    Calcium 8.6 (*)    AST 69 (*)    ALT 88 (*)    All other components within normal limits  D-DIMER, QUANTITATIVE (NOT AT [facility]) - Abnormal; Notable for the following components:   D-Dimer, Quant 1.15 (*)    All other components within normal limits  LACTATE DEHYDROGENASE - Abnormal; Notable for the following components:   LDH 262 (*)    All other components within normal limits  FERRITIN - Abnormal; Notable for the following components:   Ferritin 1,351 (*)    All other components within normal limits  FIBRINOGEN - Abnormal; Notable for the following components:    Fibrinogen 781 (*)    All other components within normal limits  C-REACTIVE PROTEIN - Abnormal; Notable for the following components:   CRP 11.3 (*)  All other components within normal limits  RESPIRATORY PANEL BY RT PCR (FLU A&B, COVID)  CULTURE, BLOOD (ROUTINE X 2)  CULTURE, BLOOD (ROUTINE X 2)  LACTIC ACID, PLASMA  PROCALCITONIN  TRIGLYCERIDES    EKG EKG Interpretation  Date/Time:  Saturday November 29 2019 06:55:09 EST Ventricular Rate:  82 PR Interval:    QRS Duration: 110 QT Interval:  459 QTC Calculation: 537 R Axis:   18 Text Interpretation: Sinus rhythm Prolonged QT interval No prior ECG for comparison. No STEMI Confirmed by Antony Blackbird [phone]) on 11/29/2019 7:11:17 AM   Radiology DG Chest Port 1 View  Result Date: 11/29/2019 CLINICAL DATA:  Fatigue, shortness of breath and body aches. Positive test for COVID-19 one week ago. EXAM: PORTABLE CHEST 1 VIEW COMPARISON:  None. FINDINGS: The heart is mildly enlarged. Bilateral pulmonary infiltrates present predominantly in the lower lung zones. No overt pulmonary edema, pleural fluid or pneumothorax. IMPRESSION: Bilateral pulmonary infiltrates predominantly in the lower lung zones. Electronically Signed   By: Aletta Edouard M.D.   On: 11/29/2019 08:49    Procedures .Critical Care Performed by: Cherre Robins, PA-C Authorized by: Cherre Robins, PA-C   Critical care provider statement:    Critical care time (minutes):  35   Critical care time was exclusive of:  Separately billable procedures and treating other patients and teaching time   Critical care was necessary to treat or prevent imminent or life-threatening deterioration of the following conditions: acute hypoxia in the setting of Covid.   Critical care was time spent personally by me on the following activities:  Blood draw for specimens, development of treatment plan with patient or surrogate, discussions with consultants, discussions with primary  provider, evaluation of patient's response to treatment, ordering and performing treatments and interventions, ordering and review of laboratory studies, ordering and review of radiographic studies, pulse oximetry, re-evaluation of patient's condition, review of old charts and obtaining history from patient or surrogate   (including critical care time)  Medications Ordered in ED Medications  sodium chloride 0.9 % bolus 1,000 mL (1,000 mLs Intravenous New Bag/Given 11/29/19 0841)  acetaminophen (TYLENOL) tablet 650 mg (650 mg Oral Given 11/29/19 0841)    ED Course  I have reviewed the triage vital signs and the nursing notes.  Pertinent labs & imaging results that were available during my care of the patient were reviewed by me and considered in my medical decision making (see chart for details).    MDM Rules/Calculators/A&P                      Patient seen and examined. Patient presents awake, alert, hemodynamically stable, afebrile, non toxic. He looks to not feel well.  On arrival he was hypoxic to 86% on room, placed on 3L [place] with improvement to 93%.  He is also tachypneic with respiratory rate in the low 30s.  Lung sounds are overall diminished throughout, no wheezing rales or rhonchi heard.  Abdomen is non tender.  He does appear dehydrated, mucous membranes are dry, decreased cap refill.  DDx includes Covid, pneumonia, URI,  other viral illness, gastritis. Given he has been around his wife who is Covid positive, suspect Covid admission labs ordered. 1L IVF and PO tylenol given. EKG shows no STEMI, does have prolonged QT interval at 537.  Chest x-ray shows bilateral pulmonary infiltrates in lower lobes. Inflammatory markers are elevated in the setting of covid.  Lab findings also significant for elevated liver enzymes.  Lactic  acid is within normal range.  No leukocytosis or anemia. Covid PCR positive.  This case was discussed with Dr. Sherry Ruffing who has seen the patient and agrees with plan  to admit. Spoke with Dr. Lorin Mercy with hospitalist service who agrees to assume care of patient and bring into the hospital for further evaluation and management.  Appreciate admission.   Dakota Cooke was evaluated in Emergency Department on 11/29/2019 for the symptoms described in the history of present illness. He was evaluated in the context of the global COVID-19 pandemic, which necessitated consideration that the patient might be at risk for infection with the SARS-CoV-2 virus that causes COVID-19. Institutional protocols and algorithms that pertain to the evaluation of patients at risk for COVID-19 are in a state of rapid change based on information released by regulatory bodies including the CDC and federal and state organizations. These policies and algorithms were followed during the patient's care in the ED.    Portions of this note were generated with [employer]. Dictation errors may occur despite best attempts at proofreading.  Final Clinical Impression(s) / ED Diagnoses Final diagnoses:  COVID-19 virus infection    Rx / DC Orders ED Discharge Orders    None       Cherre Robins, PA-C 11/29/19 1036    Tegeler, Gwenyth Allegra, MD 11/29/19 [phone]

## 2019-11-29 NOTE — Progress Notes (Signed)
I have seen the patient examined and evaluated patient independently of my partner Dr. Lorin Mercy  I have reviewed the H&P in addition Patient is stable but has been requiring increasing amounts of oxygen and is now on 6 L and satting in the low 90s Based on inflammatory markers especially CRP above 10, I think it is reasonable to dose Actemra I had a risk-benefit discussion with the patient and he was willing to proceed with the same I have explained to him that experimental nature of Actemra and he agrees to proceed  I have asked nursing to set the patient up and we will liberalize his diet We will control his hyperglycemia and I am hopeful that he will improve quickly  I spoke with his wife and updated and explained rationale of care   Verneita Griffes, MD [facility] 5:18 PM

## 2019-11-29 NOTE — ED Triage Notes (Signed)
Pt states he is having increase fatigue, SOB and body aches for since last weeks getting worse today, wife tested positive for COVID 19 a week ago.

## 2019-11-30 ENCOUNTER — Inpatient Hospital Stay (HOSPITAL_COMMUNITY): Payer: Medicare HMO

## 2019-11-30 LAB — CBC WITH DIFFERENTIAL/PLATELET
Abs Immature Granulocytes: 0.12 10*3/uL — ABNORMAL HIGH (ref 0.00–0.07)
Basophils Absolute: 0 10*3/uL (ref 0.0–0.1)
Basophils Relative: 0 %
Eosinophils Absolute: 0 10*3/uL (ref 0.0–0.5)
Eosinophils Relative: 0 %
HCT: 43 % (ref 39.0–52.0)
Hemoglobin: 14.2 g/dL (ref 13.0–17.0)
Immature Granulocytes: 2 %
Lymphocytes Relative: 11 %
Lymphs Abs: 0.8 10*3/uL (ref 0.7–4.0)
MCH: 29.2 pg (ref 26.0–34.0)
MCHC: 33 g/dL (ref 30.0–36.0)
MCV: 88.3 fL (ref 80.0–100.0)
Monocytes Absolute: 0.4 10*3/uL (ref 0.1–1.0)
Monocytes Relative: 6 %
Neutro Abs: 6.3 10*3/uL (ref 1.7–7.7)
Neutrophils Relative %: 81 %
Platelets: 199 10*3/uL (ref 150–400)
RBC: 4.87 MIL/uL (ref 4.22–5.81)
RDW: 12.2 % (ref 11.5–15.5)
WBC: 7.7 10*3/uL (ref 4.0–10.5)
nRBC: 0 % (ref 0.0–0.2)

## 2019-11-30 LAB — COMPREHENSIVE METABOLIC PANEL
ALT: 88 U/L — ABNORMAL HIGH (ref 0–44)
AST: 65 U/L — ABNORMAL HIGH (ref 15–41)
Albumin: 3.3 g/dL — ABNORMAL LOW (ref 3.5–5.0)
Alkaline Phosphatase: 44 U/L (ref 38–126)
Anion gap: 8 (ref 5–15)
BUN: 20 mg/dL (ref 8–23)
CO2: 28 mmol/L (ref 22–32)
Calcium: 8.6 mg/dL — ABNORMAL LOW (ref 8.9–10.3)
Chloride: 102 mmol/L (ref 98–111)
Creatinine, Ser: 0.85 mg/dL (ref 0.61–1.24)
GFR calc Af Amer: >60 mL/min (ref >60)
GFR calc non Af Amer: >60 mL/min (ref >60)
Glucose, Bld: 128 mg/dL — ABNORMAL HIGH (ref 70–99)
Potassium: 4.1 mmol/L (ref 3.5–5.1)
Sodium: 138 mmol/L (ref 135–145)
Total Bilirubin: 0.8 mg/dL (ref 0.3–1.2)
Total Protein: 6.9 g/dL (ref 6.5–8.1)

## 2019-11-30 LAB — PHOSPHORUS: Phosphorus: 3.5 mg/dL (ref 2.5–4.6)

## 2019-11-30 LAB — MAGNESIUM: Magnesium: 2.3 mg/dL (ref 1.7–2.4)

## 2019-11-30 LAB — C-REACTIVE PROTEIN: CRP: 12.3 mg/dL — ABNORMAL HIGH (ref <1.0)

## 2019-11-30 LAB — FERRITIN: Ferritin: 1285 ng/mL — ABNORMAL HIGH (ref 24–336)

## 2019-11-30 LAB — D-DIMER, QUANTITATIVE: D-Dimer, Quant: 1.08 ug/mL-FEU — ABNORMAL HIGH (ref 0.00–0.50)

## 2019-11-30 MED ORDER — ASCORBIC ACID 500 MG PO TABS
500.0000 mg | ORAL_TABLET | Freq: Every day | ORAL | Status: DC
  Administered 2019-12-01 – 2019-12-08 (×8): 500 mg via ORAL
  Filled 2019-11-30 (×8): qty 1

## 2019-11-30 MED ORDER — HYDRALAZINE HCL 10 MG PO TABS
10.0000 mg | ORAL_TABLET | Freq: Four times a day (QID) | ORAL | Status: DC | PRN
  Filled 2019-11-30: qty 1

## 2019-11-30 MED ORDER — HYDROCOD POLST-CPM POLST ER 10-8 MG/5ML PO SUER
5.0000 mL | Freq: Two times a day (BID) | ORAL | Status: DC
  Administered 2019-11-30 – 2019-12-08 (×17): 5 mL via ORAL
  Filled 2019-11-30 (×17): qty 5

## 2019-11-30 MED ORDER — AZITHROMYCIN 250 MG PO TABS
500.0000 mg | ORAL_TABLET | Freq: Every day | ORAL | Status: AC
  Administered 2019-11-30 – 2019-12-06 (×7): 500 mg via ORAL
  Filled 2019-11-30 (×7): qty 2

## 2019-11-30 MED ORDER — ZINC SULFATE 220 (50 ZN) MG PO CAPS
220.0000 mg | ORAL_CAPSULE | Freq: Every day | ORAL | Status: DC
  Administered 2019-12-01 – 2019-12-08 (×8): 220 mg via ORAL
  Filled 2019-11-30 (×8): qty 1

## 2019-11-30 NOTE — Progress Notes (Addendum)
Pt now requiring 15L HFNC + 15L NRB to maintain an O2 sat of 90%. At start of shift pt was on 6L HFNC, and had progressively required more oxygen throughout the night. Pt is A/ox4. He states that he is having shortness of breath. Denies chest pain. He is tachypneic 20s- 30s. MD notified and evaluated pt at bedside. Orders received for transfer to C-side in the event that heated [place] is needed.   Report given to Curt Bears, [place]. Pt transferred to Room 176.  VSS. 95 -96% on 15L HFNC and 15L NRB. No events during transport to new room. Wife called and updated on pt condition, plan of care, and new room location. All questions answered.

## 2019-11-30 NOTE — Progress Notes (Signed)
Mr. Dakota Cooke was admitted morning of 2/13 with COVID and acute hypoxic resp failure.    He does not use supplemental O2 at baseline, was saturating 89% on rm air am of 2/13, was requiring 3 Lpm in [facility] ED at time of admission, 6 Lpm a few hours later at [facility], turned up to 15 Lpm earlier this shift, and now NRB was added to keep saturation in low 90's.   He received a liter of NS in ED and steroids, remdesivir, and Actemra since admission.   He was interviewed and examined at bedside, case discussed with RN, and chart reviewed extensively.   He denies any hx of underlying heart or lung disease, has not been having any chest discomfort or leg swelling or tenderness.   He is tachypneic in low 30's, no pallor or cyanosis, no diaphoresis, alert and fully oriented, and able to speak in full sentences. No peripheral edema or JVD.   He is doing okay currently on 15 Lpm HFNC plus NRB, but with fairly rapid progression in hypoxia, he'll be transferred to progressive unit should he require heated high flow.

## 2019-11-30 NOTE — Progress Notes (Signed)
Hospitalist progress note   Patient from home, Patient going likely home, Dispo inpatient at this time given acute worsening  Dakota Cooke [id number] DOB: [birth year] DOA: 11/29/2019  PCP: Loman Brooklyn, FNP   Narrative:  64 year old white male HTN: Prediabetes, HLD, BPH, recent loss of mother and travel to [place] where he contracted it 10 days prior to arrival probably on 11/19/2019  Data Reviewed:  BUN/creatinine 20/0.8 AST/ALT 69/88-->65/80 Procalcitonin less than 0.10  COVID-19 Labs  Recent Labs    11/29/19 0726 11/30/19 0341  DDIMER 1.15* 1.08*  FERRITIN 1,351* 1,285*  LDH 262*  --   CRP 11.3* 12.3*    Lab Results  Component Value Date   SARSCOV2NAA POSITIVE (A) 11/29/2019   [place] Not Detected 11/18/2019    Assessment & Plan:  Acute hypoxic respiratory failure secondary to coronavirus 19 infection Transferred earlier this morning to 1C secondary to possible need for heated high flow with escalating oxygen requirements Stop date remdesivir/steroids 2/17 Actemra given 2/13 Paroxysmal coughing spells this a.m. so adding Tussionex to meds CXR shows more crowding of the lower lung fields and possibly right-sided pneumonia superimposed which may be bacterial therefore adding azithromycin 500 daily Nursing aware I-S to be used as well as proning and he will attempt to do this I was able to take him off the facemask and he is satting well on Salter Mild AKI Stop losartan 50, continue treatment of HTN with hydralazine 10 mg as needed >160 as needed Transaminitis No prior evidence of hepatitis-monitor trends as given Actemra earlier this admission Depression Situational-if majorly depressed may consider SSRI in the next several days HTN See above discussion BPH No need for meds at this juncture Prediabetes Sugars 128 and on Decadron-if needed and sugars above 180-glycemic control  Called wife and updated [phone]   Subjective: Events noted  overnight patient seems to have needed more oxygen When I saw him he had a paroxysm of coughing and some tan sputum No chest pain no fever no chills No nausea no vomiting no diarrhea no blurred vision no double vision feels about the same as prior Came off facemask and was satting well on salter high flow  Consultants:   None  Objective: Vitals:   11/30/19 0020 11/30/19 0115 11/30/19 0200 11/30/19 0447  BP:  116/77 115/77 123/87  Pulse:  (!) 57 (!) 56 (!) 57  Resp: (!) 31 (!) 31 (!) 31 (!) 28  Temp:  98.1 F (36.7 C) 98 F (36.7 C) (!) 97.5 F (36.4 C)  TempSrc:    Axillary  SpO2: 94% 97% 96% 96%  Weight:      Height:        Intake/Output Summary (Last 24 hours) at 11/30/2019 0743 Last data filed at 11/30/2019 0700 Gross per 24 hour  Intake 340 ml  Output 200 ml  Net 140 ml   Filed Weights   11/29/19 1600  Weight: 92.5 kg    Examination: Awake alert coherent no distress looks tired however EOMI NCAT no focal deficit Abdomen soft no rebound no guarding No lower extremity edema ROM intact with no focal deficit   Scheduled Meds: . aspirin  81 mg Oral Daily  . atorvastatin  10 mg Oral q1800  . azithromycin  500 mg Oral Daily  . chlorpheniramine-HYDROcodone  5 mL Oral Q12H  . dexamethasone (DECADRON) injection  6 mg Intravenous Q24H  . enoxaparin (LOVENOX) injection  40 mg Subcutaneous Q24H  . sodium chloride flush  3 mL Intravenous Q12H  Continuous Infusions: . sodium chloride    . remdesivir 100 mg in NS 100 mL       LOS: 1 day   Time spent: [place], MD [facility]  11/30/2019, 7:43 AM

## 2019-12-01 LAB — COMPREHENSIVE METABOLIC PANEL
ALT: 84 U/L — ABNORMAL HIGH (ref 0–44)
AST: 56 U/L — ABNORMAL HIGH (ref 15–41)
Albumin: 3.2 g/dL — ABNORMAL LOW (ref 3.5–5.0)
Alkaline Phosphatase: 43 U/L (ref 38–126)
Anion gap: 10 (ref 5–15)
BUN: 28 mg/dL — ABNORMAL HIGH (ref 8–23)
CO2: 26 mmol/L (ref 22–32)
Calcium: 8.7 mg/dL — ABNORMAL LOW (ref 8.9–10.3)
Chloride: 104 mmol/L (ref 98–111)
Creatinine, Ser: 0.88 mg/dL (ref 0.61–1.24)
GFR calc Af Amer: >60 mL/min (ref >60)
GFR calc non Af Amer: >60 mL/min (ref >60)
Glucose, Bld: 131 mg/dL — ABNORMAL HIGH (ref 70–99)
Potassium: 3.9 mmol/L (ref 3.5–5.1)
Sodium: 140 mmol/L (ref 135–145)
Total Bilirubin: 0.7 mg/dL (ref 0.3–1.2)
Total Protein: 6.3 g/dL — ABNORMAL LOW (ref 6.5–8.1)

## 2019-12-01 LAB — CBC WITH DIFFERENTIAL/PLATELET
Abs Immature Granulocytes: 0.11 10*3/uL — ABNORMAL HIGH (ref 0.00–0.07)
Basophils Absolute: 0 10*3/uL (ref 0.0–0.1)
Basophils Relative: 0 %
Eosinophils Absolute: 0 10*3/uL (ref 0.0–0.5)
Eosinophils Relative: 0 %
HCT: 41.2 % (ref 39.0–52.0)
Hemoglobin: 13.8 g/dL (ref 13.0–17.0)
Immature Granulocytes: 1 %
Lymphocytes Relative: 7 %
Lymphs Abs: 0.8 10*3/uL (ref 0.7–4.0)
MCH: 29.6 pg (ref 26.0–34.0)
MCHC: 33.5 g/dL (ref 30.0–36.0)
MCV: 88.4 fL (ref 80.0–100.0)
Monocytes Absolute: 0.5 10*3/uL (ref 0.1–1.0)
Monocytes Relative: 5 %
Neutro Abs: 9.6 10*3/uL — ABNORMAL HIGH (ref 1.7–7.7)
Neutrophils Relative %: 87 %
Platelets: 242 10*3/uL (ref 150–400)
RBC: 4.66 MIL/uL (ref 4.22–5.81)
RDW: 12.5 % (ref 11.5–15.5)
WBC: 11 10*3/uL — ABNORMAL HIGH (ref 4.0–10.5)
nRBC: 0 % (ref 0.0–0.2)

## 2019-12-01 LAB — MAGNESIUM: Magnesium: 2.2 mg/dL (ref 1.7–2.4)

## 2019-12-01 LAB — C-REACTIVE PROTEIN: CRP: 7.3 mg/dL — ABNORMAL HIGH (ref <1.0)

## 2019-12-01 LAB — PHOSPHORUS: Phosphorus: 3.4 mg/dL (ref 2.5–4.6)

## 2019-12-01 LAB — D-DIMER, QUANTITATIVE: D-Dimer, Quant: 0.77 ug/mL-FEU — ABNORMAL HIGH (ref 0.00–0.50)

## 2019-12-01 LAB — FERRITIN: Ferritin: 1058 ng/mL — ABNORMAL HIGH (ref 24–336)

## 2019-12-01 NOTE — Progress Notes (Addendum)
Hospitalist progress note   Patient from home, Patient going likely home, Dispo inpatient at this time given acute worsening  Valley Meneley [id number] DOB: 01/28/1956 DOA: 11/29/2019  PCP: Loman Brooklyn, FNP   Narrative:  64 year old white male HTN: Prediabetes, HLD, BPH, recent loss of mother and travel to [place] where he contracted it 10 days prior to arrival probably on 11/19/2019 He developed worsening oxygenation 2/13 and was transferred to the hi flow unit  Data Reviewed:  BUN/creatinine 20/0.8--->28/0.8 AST/ALT 69/88-->65/80--->56/84 Procalcitonin less than 0.10 WBC 11.0  COVID-19 Labs  Recent Labs    11/29/19 0726 11/30/19 0341 12/01/19 0138  DDIMER 1.15* 1.08* 0.77*  FERRITIN 1,351* 1,285* 1,058*  LDH 262*  --   --   CRP 11.3* 12.3* 7.3*    Lab Results  Component Value Date   SARSCOV2NAA POSITIVE (A) 11/29/2019   [place] Not Detected 11/18/2019    Assessment & Plan:  Acute hypoxic respiratory failure secondary to coronavirus 19 infection Transferred 2/13 1C secondary to possible need for heated high flow with escalating oxygen requirements Stop date remdesivir/steroids 2/17 Actemra given 2/13 on Tussionex Started azithromycin given some worsening of R base of CXR 2/14-reevaluate as as needed Mild AKI Stop losartan 50, continue treatment of HTN with hydralazine 10 mg as needed >160 as needed Transaminitis No prior evidence of hepatitis-LFts have remained stable range during admission Depression Situational even the fact that his family member died-outpatient follow-up HTN See above discussion BPH No need for meds at this juncture Prediabetes Sugars 130 range and holding stable despite steroids  Called wife and updated [phone] fully and is aware of likley longer ospital stay  Subjective:  looks fair but feels unchanged-winded with activity No cp Some sputum Coming off of 15 litrs his sat drop to the hi 88 and then 86 No chills rigor  cp diarr and hasnt had a stool  Consultants:   None  Objective: Vitals:   11/30/19 2005 12/01/19 0020 12/01/19 0422 12/01/19 0619  BP: 133/71 106/62 114/63   Pulse: 73 (!) 52 (!) 54 (!) 52  Resp: (!) 21 (!) 23 17 (!) 23  Temp: 97.8 F (36.6 C) 98.1 F (36.7 C) 98.2 F (36.8 C)   TempSrc: Oral Axillary Oral   SpO2: 90% 91% 93% 92%  Weight:      Height:        Intake/Output Summary (Last 24 hours) at 12/01/2019 0841 Last data filed at 12/01/2019 0751 Gross per 24 hour  Intake 580 ml  Output 800 ml  Net -220 ml   Filed Weights   11/29/19 1600  Weight: 92.5 kg    Examination: Awake alert coherent no distress lfair in nad at this time cta b no added sound no rales no rhonchi EOMI NCAT no focal deficit Abdomen soft no rebound no guarding No lower extremity edema ROM intact with no focal deficit   Scheduled Meds: . vitamin C  500 mg Oral Daily  . aspirin  81 mg Oral Daily  . atorvastatin  10 mg Oral q1800  . azithromycin  500 mg Oral Daily  . chlorpheniramine-HYDROcodone  5 mL Oral Q12H  . dexamethasone (DECADRON) injection  6 mg Intravenous Q24H  . enoxaparin (LOVENOX) injection  40 mg Subcutaneous Q24H  . sodium chloride flush  3 mL Intravenous Q12H  . zinc sulfate  220 mg Oral Daily   Continuous Infusions: . sodium chloride    . remdesivir 100 mg in NS 100 mL Stopped (11/30/19 1001)  LOS: 2 days   Time spent: [place], MD [facility]  12/01/2019, 8:41 AM

## 2019-12-02 LAB — COMPREHENSIVE METABOLIC PANEL
ALT: 83 U/L — ABNORMAL HIGH (ref 0–44)
AST: 54 U/L — ABNORMAL HIGH (ref 15–41)
Albumin: 3 g/dL — ABNORMAL LOW (ref 3.5–5.0)
Alkaline Phosphatase: 42 U/L (ref 38–126)
Anion gap: 10 (ref 5–15)
BUN: 24 mg/dL — ABNORMAL HIGH (ref 8–23)
CO2: 27 mmol/L (ref 22–32)
Calcium: 8.3 mg/dL — ABNORMAL LOW (ref 8.9–10.3)
Chloride: 106 mmol/L (ref 98–111)
Creatinine, Ser: 0.85 mg/dL (ref 0.61–1.24)
GFR calc Af Amer: >60 mL/min (ref >60)
GFR calc non Af Amer: >60 mL/min (ref >60)
Glucose, Bld: 96 mg/dL (ref 70–99)
Potassium: 4.2 mmol/L (ref 3.5–5.1)
Sodium: 143 mmol/L (ref 135–145)
Total Bilirubin: 1 mg/dL (ref 0.3–1.2)
Total Protein: 5.9 g/dL — ABNORMAL LOW (ref 6.5–8.1)

## 2019-12-02 LAB — CBC WITH DIFFERENTIAL/PLATELET
Abs Immature Granulocytes: 0.13 10*3/uL — ABNORMAL HIGH (ref 0.00–0.07)
Basophils Absolute: 0 10*3/uL (ref 0.0–0.1)
Basophils Relative: 0 %
Eosinophils Absolute: 0 10*3/uL (ref 0.0–0.5)
Eosinophils Relative: 0 %
HCT: 41.1 % (ref 39.0–52.0)
Hemoglobin: 13.9 g/dL (ref 13.0–17.0)
Immature Granulocytes: 2 %
Lymphocytes Relative: 12 %
Lymphs Abs: 1.1 10*3/uL (ref 0.7–4.0)
MCH: 30.2 pg (ref 26.0–34.0)
MCHC: 33.8 g/dL (ref 30.0–36.0)
MCV: 89.2 fL (ref 80.0–100.0)
Monocytes Absolute: 0.4 10*3/uL (ref 0.1–1.0)
Monocytes Relative: 4 %
Neutro Abs: 7.2 10*3/uL (ref 1.7–7.7)
Neutrophils Relative %: 82 %
Platelets: 272 10*3/uL (ref 150–400)
RBC: 4.61 MIL/uL (ref 4.22–5.81)
RDW: 12.7 % (ref 11.5–15.5)
WBC: 8.7 10*3/uL (ref 4.0–10.5)
nRBC: 0 % (ref 0.0–0.2)

## 2019-12-02 LAB — MAGNESIUM: Magnesium: 2.1 mg/dL (ref 1.7–2.4)

## 2019-12-02 LAB — FERRITIN: Ferritin: 935 ng/mL — ABNORMAL HIGH (ref 24–336)

## 2019-12-02 LAB — PHOSPHORUS: Phosphorus: 3.9 mg/dL (ref 2.5–4.6)

## 2019-12-02 LAB — C-REACTIVE PROTEIN: CRP: 3 mg/dL — ABNORMAL HIGH (ref <1.0)

## 2019-12-02 LAB — D-DIMER, QUANTITATIVE: D-Dimer, Quant: 0.72 ug/mL-FEU — ABNORMAL HIGH (ref 0.00–0.50)

## 2019-12-02 NOTE — Progress Notes (Addendum)
Hospitalist progress note   Patient from home, Patient going likely home, Dispo inpatient at this time given acute worsening  Dakota Cooke [id number] DOB: 1956/05/11 DOA: 11/29/2019  PCP: Loman Brooklyn, FNP   Narrative:  64 year old white male HTN: Prediabetes, HLD, BPH, recent loss of mother and travel to [place] where he contracted it 10 days prior to arrival probably on 11/19/2019 He developed worsening oxygenation 2/13 and was transferred to the hi flow unit  Data Reviewed:  BUN/creatinine 20/0.8--->28/0.8------>24/0.8 AST/ALT 69/88-->65/80--->56/84-->54/83 Procalcitonin less than 0.10 WBC 11.0-->8.7  COVID-19 Labs  Recent Labs    11/30/19 0341 12/01/19 0138 12/02/19 0347  DDIMER 1.08* 0.77* 0.72*  FERRITIN 1,285* 1,058* 935*  CRP 12.3* 7.3* 3.0*    Lab Results  Component Value Date   SARSCOV2NAA POSITIVE (A) 11/29/2019   [place] Not Detected 11/18/2019    Assessment & Plan:  Acute hypoxic respiratory failure secondary to coronavirus 19 infection Transferred 2/13 1C secondary to possible need for heated high flow with escalating oxygen requirements--still feeling winded and sob Getting CXR in am r/o worsening BAct PNa--get PCT in am as well--Started azithromycin given some worsening of R base of CXR 2/14-with stop date on 2/18 Stop date remdesivir/steroids 2/17-inflammatory markers seem to be decreasing well Actemra given 2/13 Mild AKI Stop losartan 50, continue treatment of HTN with hydralazine 10 mg as needed >160 as needed Transaminitis No prior evidence of hepatitis-LFts have remained stable range during admission Depression Situational even the fact that his family member died-outpatient follow-up HTN See above discussion BPH No need for meds at this juncture Prediabetes Sugars below 140  Called wife and updated [phone] on 2/15--did not reach her on phone 2/16   Subjective:  Winded just sitting up took him 30 min to do so Needed NRB +  hi flow to move around Talking comfortably  Consultants:   None  Objective: Vitals:   12/01/19 2016 12/02/19 0000 12/02/19 0406 12/02/19 0824  BP:  129/72 (!) 118/59 (!) 149/81  Pulse: 74 (!) 51 (!) 53 (!) 55  Resp: 20 (!) 25 (!) 25 (!) 28  Temp:  98.6 F (37 C) 98.5 F (36.9 C) 98.7 F (37.1 C)  TempSrc:  Oral Oral Axillary  SpO2: (!) 89% 90% 90% (!) 89%  Weight:      Height:        Intake/Output Summary (Last 24 hours) at 12/02/2019 1020 Last data filed at 12/02/2019 [id number] Gross per 24 hour  Intake 360 ml  Output 180 ml  Net 180 ml   Filed Weights   11/29/19 1600  Weight: 92.5 kg    Examination:  eomi no focal deficit no accessory muscle use No rale sno rhonchi no egophony abd soft nt dn no rebound no guard No le edmea s1 s 2 mild tahcy rom intact   Scheduled Meds: . vitamin C  500 mg Oral Daily  . aspirin  81 mg Oral Daily  . atorvastatin  10 mg Oral q1800  . azithromycin  500 mg Oral Daily  . chlorpheniramine-HYDROcodone  5 mL Oral Q12H  . dexamethasone (DECADRON) injection  6 mg Intravenous Q24H  . enoxaparin (LOVENOX) injection  40 mg Subcutaneous Q24H  . sodium chloride flush  3 mL Intravenous Q12H  . zinc sulfate  220 mg Oral Daily   Continuous Infusions: . sodium chloride    . remdesivir 100 mg in NS 100 mL 100 mg (12/02/19 0914)     LOS: 3 days   Time spent: [address]  Verlon Au, MD [facility]  12/02/2019, 10:20 AM

## 2019-12-02 NOTE — Progress Notes (Signed)
Spoke with patient's spouse Abigail Butts and provided updates.

## 2019-12-03 ENCOUNTER — Inpatient Hospital Stay (HOSPITAL_COMMUNITY): Payer: Medicare HMO

## 2019-12-03 LAB — CBC WITH DIFFERENTIAL/PLATELET
Abs Immature Granulocytes: 0.25 10*3/uL — ABNORMAL HIGH (ref 0.00–0.07)
Basophils Absolute: 0 10*3/uL (ref 0.0–0.1)
Basophils Relative: 0 %
Eosinophils Absolute: 0 10*3/uL (ref 0.0–0.5)
Eosinophils Relative: 0 %
HCT: 42.1 % (ref 39.0–52.0)
Hemoglobin: 13.7 g/dL (ref 13.0–17.0)
Immature Granulocytes: 3 %
Lymphocytes Relative: 12 %
Lymphs Abs: 1.1 10*3/uL (ref 0.7–4.0)
MCH: 29.1 pg (ref 26.0–34.0)
MCHC: 32.5 g/dL (ref 30.0–36.0)
MCV: 89.4 fL (ref 80.0–100.0)
Monocytes Absolute: 0.3 10*3/uL (ref 0.1–1.0)
Monocytes Relative: 4 %
Neutro Abs: 7.2 10*3/uL (ref 1.7–7.7)
Neutrophils Relative %: 81 %
Platelets: 288 10*3/uL (ref 150–400)
RBC: 4.71 MIL/uL (ref 4.22–5.81)
RDW: 12.5 % (ref 11.5–15.5)
WBC: 9 10*3/uL (ref 4.0–10.5)
nRBC: 0 % (ref 0.0–0.2)

## 2019-12-03 LAB — PROCALCITONIN: Procalcitonin: 0.12 ng/mL

## 2019-12-03 LAB — COMPREHENSIVE METABOLIC PANEL
ALT: 127 U/L — ABNORMAL HIGH (ref 0–44)
AST: 74 U/L — ABNORMAL HIGH (ref 15–41)
Albumin: 3.2 g/dL — ABNORMAL LOW (ref 3.5–5.0)
Alkaline Phosphatase: 42 U/L (ref 38–126)
Anion gap: 10 (ref 5–15)
BUN: 24 mg/dL — ABNORMAL HIGH (ref 8–23)
CO2: 27 mmol/L (ref 22–32)
Calcium: 8.3 mg/dL — ABNORMAL LOW (ref 8.9–10.3)
Chloride: 104 mmol/L (ref 98–111)
Creatinine, Ser: 0.79 mg/dL (ref 0.61–1.24)
GFR calc Af Amer: >60 mL/min (ref >60)
GFR calc non Af Amer: >60 mL/min (ref >60)
Glucose, Bld: 99 mg/dL (ref 70–99)
Potassium: 4.4 mmol/L (ref 3.5–5.1)
Sodium: 141 mmol/L (ref 135–145)
Total Bilirubin: 0.6 mg/dL (ref 0.3–1.2)
Total Protein: 6 g/dL — ABNORMAL LOW (ref 6.5–8.1)

## 2019-12-03 LAB — D-DIMER, QUANTITATIVE: D-Dimer, Quant: 0.97 ug/mL-FEU — ABNORMAL HIGH (ref 0.00–0.50)

## 2019-12-03 LAB — FERRITIN: Ferritin: 878 ng/mL — ABNORMAL HIGH (ref 24–336)

## 2019-12-03 LAB — C-REACTIVE PROTEIN: CRP: 1.6 mg/dL — ABNORMAL HIGH (ref <1.0)

## 2019-12-03 LAB — BRAIN NATRIURETIC PEPTIDE: B Natriuretic Peptide: 47.2 pg/mL (ref 0.0–100.0)

## 2019-12-03 MED ORDER — IPRATROPIUM-ALBUTEROL 20-100 MCG/ACT IN AERS
1.0000 | INHALATION_SPRAY | Freq: Four times a day (QID) | RESPIRATORY_TRACT | Status: DC
  Administered 2019-12-03 – 2019-12-08 (×20): 1 via RESPIRATORY_TRACT
  Filled 2019-12-03: qty 4

## 2019-12-03 MED ORDER — FUROSEMIDE 10 MG/ML IJ SOLN
40.0000 mg | Freq: Once | INTRAMUSCULAR | Status: AC
  Administered 2019-12-03: 40 mg via INTRAVENOUS
  Filled 2019-12-03: qty 4

## 2019-12-03 NOTE — Progress Notes (Signed)
PROGRESS NOTE    Dakota Cooke  [id number] DOB: Oct 24, 1955 DOA: 11/29/2019 PCP: Loman Brooklyn, FNP   Brief Narrative:  64 year old with history of HTN, prediabetes, HLD, BPH recent loss of mother and travel to [place] where he contracted COVID-19 pneumonia.  Admitted to the hospital on 2/13 for worsening hypoxic respiratory failure.  Started on steroids, remdesivir, received a dose of Actemra.  Oxygen levels were slowly being weaned.   Assessment & Plan:   Principal Problem:   Acute respiratory disease due to COVID-19 virus Active Problems:   Hypertension   Hyperlipidemia   BPH (benign prostatic hyperplasia)  Acute hypoxic respiratory failure secondary to COVID-19 pneumonia -Oxygen levels- 13L HF [place] -Remdesivir- D4 -Decadrone- D4 -Actemra- 2/13 -Convalescent plasma- None -Antibiotics- Azithromycin 500mg  po Daily.  -procalcitonin- Neg (on admission), Neg (2/17) -BNP 42 (2/17) -Lasix - 40 mg IV (2/17) -Chest x-ray- B/l Infiltrates  -Supportive care-antitussive, inhalers, I-S/flutter -CODE STATUS confirmed -Vitamin C & Zinc. Prone >16hrs/day.  -Routine: Labs have been reviewed including ferritin, LDH, CRP, d-dimer, fibrinogen.  Will need to trend this lab daily.  Transaminitis -Unknown exact etiology.  Possibly medication related but will continue to trend this for now.  If necessary will pursue further work-up -Hold atorvastatin  Essential hypertension -Losartan on hold  Depression -Unsure if this is chronic diagnosis.  Likely grieving from recent family member loss  BPH -None  DVT prophylaxis: Lovenox Code Status: Full code Family Communication:  Spoke with Abigail Butts. Wants Spouse and Son to be made both Health care Proxy.  Disposition Plan:   Patient From= home  Patient Anticipated D/C place= Home  Barriers= still significantly hypoxic on high flow nasal cannula.  Unsafe for discharge.  Subjective: Still shortness of breath with minimal exertion.    Review of Systems Otherwise negative except as per HPI, including: General: Denies fever, chills, night sweats or unintended weight loss. Resp: Denies cough Cardiac: Denies chest pain, palpitations, orthopnea, paroxysmal nocturnal dyspnea. GI: Denies abdominal pain, nausea, vomiting, diarrhea or constipation GU: Denies dysuria, frequency, hesitancy or incontinence MS: Denies muscle aches, joint pain or swelling Neuro: Denies headache, neurologic deficits (focal weakness, numbness, tingling), abnormal gait Psych: Denies anxiety, depression, SI/HI/AVH Skin: Denies new rashes or lesions ID: Denies sick contacts, exotic exposures, travel  Examination:  General exam: Appears calm and comfortable; 13L HFNC Respiratory system: B/l diffuse Rhonchi.  Cardiovascular system: S1 & S2 heard, RRR. No JVD, murmurs, rubs, gallops or clicks. No pedal edema. Gastrointestinal system: Abdomen is nondistended, soft and nontender. No organomegaly or masses felt. Normal bowel sounds heard. Central nervous system: Alert and oriented. No focal neurological deficits. Extremities: Symmetric 4+ x 5 power. Skin: No rashes, lesions or ulcers Psychiatry: Judgement and insight appear normal. Mood & affect appropriate.   Objective: Vitals:   12/03/19 0027 12/03/19 0420 12/03/19 0444 12/03/19 0739  BP: 120/66  126/69 135/77  Pulse: 60 (!) 49 71 (!) 57  Resp: (!) 21 (!) 23 (!) 22 (!) 24  Temp: 98.3 F (36.8 C)  98.9 F (37.2 C) 98.1 F (36.7 C)  TempSrc: Oral  Oral Oral  SpO2: 91% (!) 88% 95%   Weight:      Height:        Intake/Output Summary (Last 24 hours) at 12/03/2019 0824 Last data filed at 12/03/2019 0740 Gross per 24 hour  Intake 420 ml  Output 900 ml  Net -480 ml   Filed Weights   11/29/19 1600  Weight: 92.5 kg     Data Reviewed:  CBC: Recent Labs  Lab 11/29/19 0726 11/30/19 0341 12/01/19 0138 12/02/19 0347 12/03/19 0145  WBC 7.8 7.7 11.0* 8.7 9.0  NEUTROABS 6.7 6.3 9.6* 7.2  7.2  HGB 15.3 14.2 13.8 13.9 13.7  HCT 45.1 43.0 41.2 41.1 42.1  MCV 87.1 88.3 88.4 89.2 89.4  PLT 164 199 242 272 [id number]   Basic Metabolic Panel: Recent Labs  Lab 11/29/19 0726 11/30/19 0341 12/01/19 0138 12/02/19 0347 12/03/19 0145  NA 134* 138 140 143 141  K 3.4* 4.1 3.9 4.2 4.4  CL 95* 102 104 106 104  CO2 25 28 26 27 27   GLUCOSE 127* 128* 131* 96 99  BUN 14 20 28* 24* 24*  CREATININE 0.96 0.85 0.88 0.85 0.79  CALCIUM 8.6* 8.6* 8.7* 8.3* 8.3*  MG  --  2.3 2.2 2.1  --   PHOS  --  3.5 3.4 3.9  --    GFR: Estimated Creatinine Clearance: 101.6 mL/min (by C-G formula based on SCr of 0.79 mg/dL). Liver Function Tests: Recent Labs  Lab 11/29/19 0726 11/30/19 0341 12/01/19 0138 12/02/19 0347 12/03/19 0145  AST 69* 65* 56* 54* 74*  ALT 88* 88* 84* 83* 127*  ALKPHOS 45 44 43 42 42  BILITOT 1.2 0.8 0.7 1.0 0.6  PROT 6.9 6.9 6.3* 5.9* 6.0*  ALBUMIN 3.6 3.3* 3.2* 3.0* 3.2*   No results for input(s): LIPASE, AMYLASE in the last 168 hours. No results for input(s): AMMONIA in the last 168 hours. Coagulation Profile: No results for input(s): INR, PROTIME in the last 168 hours. Cardiac Enzymes: No results for input(s): CKTOTAL, CKMB, CKMBINDEX, TROPONINI in the last 168 hours. BNP (last 3 results) No results for input(s): PROBNP in the last 8760 hours. HbA1C: No results for input(s): HGBA1C in the last 72 hours. CBG: No results for input(s): GLUCAP in the last 168 hours. Lipid Profile: No results for input(s): CHOL, HDL, LDLCALC, TRIG, CHOLHDL, LDLDIRECT in the last 72 hours. Thyroid Function Tests: No results for input(s): TSH, T4TOTAL, FREET4, T3FREE, THYROIDAB in the last 72 hours. Anemia Panel: Recent Labs    12/02/19 0347 12/03/19 0145  FERRITIN 935* 878*   Sepsis Labs: Recent Labs  Lab 11/29/19 0726  PROCALCITON <0.10  LATICACIDVEN 1.4    Recent Results (from the past 240 hour(s))  Respiratory Panel by RT PCR (Flu A&B, Covid) - Nasopharyngeal Swab      Status: Abnormal   Collection Time: 11/29/19  7:05 AM   Specimen: Nasopharyngeal Swab  Result Value Ref Range Status   SARS Coronavirus 2 by RT PCR POSITIVE (A) NEGATIVE Final    Comment: RESULT CALLED TO, READ BACK BY AND VERIFIED WITH: CARLIN ON [id number] AT 1004 BY NFIELDS (NOTE) SARS-CoV-2 target nucleic acids are DETECTED. SARS-CoV-2 RNA is generally detectable in upper respiratory specimens  during the acute phase of infection. Positive results are indicative of the presence of the identified virus, but do not rule out bacterial infection or co-infection with other pathogens not detected by the test. Clinical correlation with patient history and other diagnostic information is necessary to determine patient infection status. The expected result is Negative. Fact Sheet for Patients:  PinkCheek.be Fact Sheet for Healthcare Providers: GravelBags.it This test is not yet approved or cleared by the [place] FDA and  has been authorized for detection and/or diagnosis of SARS-CoV-2 by FDA under an Emergency Use Authorization (EUA).  This EUA will remain in effect (meaning this test can be used) f or the duration of  the COVID-19  declaration under Section 564(b)(1) of the Act, 21 U.S.C. section 360bbb-3(b)(1), unless the authorization is terminated or revoked sooner.    Influenza A by PCR NEGATIVE NEGATIVE Final   Influenza B by PCR NEGATIVE NEGATIVE Final    Comment: (NOTE) The Xpert Xpress SARS-CoV-2/FLU/RSV assay is intended as an aid in  the diagnosis of influenza from Nasopharyngeal swab specimens and  should not be used as a sole basis for treatment. Nasal washings and  aspirates are unacceptable for Xpert Xpress SARS-CoV-2/FLU/RSV  testing. Fact Sheet for Patients: PinkCheek.be Fact Sheet for Healthcare Providers: GravelBags.it This test is not yet approved  or cleared by the [place] FDA and  has been authorized for detection and/or diagnosis of SARS-CoV-2 by  FDA under an Emergency Use Authorization (EUA). This EUA will remain  in effect (meaning this test can be used) for the duration of the  Covid-19 declaration under Section 564(b)(1) of the Act, 21  U.S.C. section 360bbb-3(b)(1), unless the authorization is  terminated or revoked. Performed at [facility], [place] [address], [place], [place] [address]   Blood Culture (routine x 2)     Status: None (Preliminary result)   Collection Time: 11/29/19  7:30 AM   Specimen: BLOOD  Result Value Ref Range Status   Specimen Description BLOOD RIGHT ANTECUBITAL  Final   Special Requests   Final    BOTTLES DRAWN AEROBIC AND ANAEROBIC Blood Culture adequate volume   Culture   Final    NO GROWTH 4 DAYS Performed at [facility], [place] [address] [place], [place] [address]    Report Status PENDING  Incomplete  Blood Culture (routine x 2)     Status: None (Preliminary result)   Collection Time: 11/29/19  7:55 AM   Specimen: BLOOD RIGHT ARM  Result Value Ref Range Status   Specimen Description BLOOD RIGHT ARM  Final   Special Requests   Final    BOTTLES DRAWN AEROBIC AND ANAEROBIC Blood Culture adequate volume   Culture   Final    NO GROWTH 4 DAYS Performed at [facility], [place] [address] [place], [place] [address]    Report Status PENDING  Incomplete      Radiology Studies: No results found.  Scheduled Meds: . vitamin C  500 mg Oral Daily  . aspirin  81 mg Oral Daily  . atorvastatin  10 mg Oral q1800  . azithromycin  500 mg Oral Daily  . chlorpheniramine-HYDROcodone  5 mL Oral Q12H  . dexamethasone (DECADRON) injection  6 mg Intravenous Q24H  . enoxaparin (LOVENOX) injection  40 mg Subcutaneous Q24H  . furosemide  40 mg Intravenous Once  . Ipratropium-Albuterol  1 puff Inhalation Q6H  . zinc sulfate  220 mg Oral Daily   Continuous Infusions: . remdesivir 100  mg in NS 100 mL Stopped (12/02/19 1000)    LOS: 4 days   Time spent= 35 mins  Jaden Abreu Arsenio Loader, MD [facility]  If 7PM-7AM, please contact night-coverage  12/03/2019, 8:24 AM

## 2019-12-03 NOTE — Addendum Note (Signed)
Addended by: Loman Brooklyn on: 12/03/2019 04:43 PM   Modules accepted: Level of Service

## 2019-12-03 NOTE — Plan of Care (Signed)
  Problem: Respiratory: Goal: Will maintain a patent airway Outcome: Progressing Goal: Complications related to the disease process, condition or treatment will be avoided or minimized Outcome: Progressing   

## 2019-12-04 LAB — BASIC METABOLIC PANEL
Anion gap: 13 (ref 5–15)
BUN: 28 mg/dL — ABNORMAL HIGH (ref 8–23)
CO2: 25 mmol/L (ref 22–32)
Calcium: 8.5 mg/dL — ABNORMAL LOW (ref 8.9–10.3)
Chloride: 102 mmol/L (ref 98–111)
Creatinine, Ser: 0.85 mg/dL (ref 0.61–1.24)
GFR calc Af Amer: >60 mL/min (ref >60)
GFR calc non Af Amer: >60 mL/min (ref >60)
Glucose, Bld: 87 mg/dL (ref 70–99)
Potassium: 4.2 mmol/L (ref 3.5–5.1)
Sodium: 140 mmol/L (ref 135–145)

## 2019-12-04 LAB — FERRITIN: Ferritin: 778 ng/mL — ABNORMAL HIGH (ref 24–336)

## 2019-12-04 LAB — CBC
HCT: 43.2 % (ref 39.0–52.0)
Hemoglobin: 14.4 g/dL (ref 13.0–17.0)
MCH: 29.6 pg (ref 26.0–34.0)
MCHC: 33.3 g/dL (ref 30.0–36.0)
MCV: 88.9 fL (ref 80.0–100.0)
Platelets: 324 10*3/uL (ref 150–400)
RBC: 4.86 MIL/uL (ref 4.22–5.81)
RDW: 12.3 % (ref 11.5–15.5)
WBC: 12 10*3/uL — ABNORMAL HIGH (ref 4.0–10.5)
nRBC: 0 % (ref 0.0–0.2)

## 2019-12-04 LAB — CULTURE, BLOOD (ROUTINE X 2)
Culture: NO GROWTH
Culture: NO GROWTH
Special Requests: ADEQUATE
Special Requests: ADEQUATE

## 2019-12-04 LAB — C-REACTIVE PROTEIN: CRP: 1 mg/dL — ABNORMAL HIGH (ref <1.0)

## 2019-12-04 LAB — D-DIMER, QUANTITATIVE: D-Dimer, Quant: 1.3 ug/mL-FEU — ABNORMAL HIGH (ref 0.00–0.50)

## 2019-12-04 LAB — MAGNESIUM: Magnesium: 2.1 mg/dL (ref 1.7–2.4)

## 2019-12-04 NOTE — Progress Notes (Signed)
PROGRESS NOTE    Dakota Cooke  [id number] DOB: 1956-07-29 DOA: 11/29/2019 PCP: Loman Brooklyn, FNP   Brief Narrative:  64 year old with history of HTN, prediabetes, HLD, BPH recent loss of mother and travel to [place] where he contracted COVID-19 pneumonia.  Admitted to the hospital on 2/13 for worsening hypoxic respiratory failure.  Started on steroids, remdesivir, received a dose of Actemra.  Oxygen levels were slowly being weaned.   Assessment & Plan:   Principal Problem:   Acute respiratory disease due to COVID-19 virus Active Problems:   Hypertension   Hyperlipidemia   BPH (benign prostatic hyperplasia)  Acute hypoxic respiratory failure secondary to COVID-19 pneumonia; persistent.  -Oxygen levels-still on 13 L high flow nasal cannula.  Dyspneic on exertion.  Not on home oxygen. -Remdesivir-day 5 -Decadrone-day 5 -Actemra- 2/13 -Convalescent plasma- None -Antibiotics- Azithromycin 500mg  po Daily.  -procalcitonin- Neg (on admission), Neg (2/17) -BNP 42 (2/17) -Lasix - 40 mg IV (2/17), -1.7 L -Chest x-ray- B/l Infiltrates  -Supportive care-antitussive, inhalers, I-S/flutter -CODE STATUS confirmed -Vitamin C & Zinc. Prone >16hrs/day.  -Routine: Labs have been reviewed including ferritin, LDH, CRP, d-dimer, fibrinogen.  Will need to trend this lab daily.  Transaminitis -Unknown exact etiology.  Possibly medication related but will continue to trend this for now.  If necessary will pursue further work-up -Hold atorvastatin  Essential hypertension -Losartan on hold  Depression -Unsure if this is chronic diagnosis.  Likely grieving from recent family member loss  BPH -None  DVT prophylaxis: Lovenox Code Status: Full code Family Communication:  Left his wife a Advertising account executive.  Disposition Plan:   Patient From= home  Patient Anticipated D/C place= Home  Barriers= significantly hypoxic requiring high flow nasal cannula.  Not on home oxygen, unsafe for  discharge.  Will discharge once weaned off oxygen.  Suspect he will still be here in the hospital for next 3-5 days if not longer.  Subjective: Feels slightly better than yesterday but still on 13L HF [place]. Using incentive spirometer.  Diuresed well yesterday. Still has some dyspnea on exertion  Review of Systems Otherwise negative except as per HPI, including: General = no fevers, chills, dizziness,  fatigue HEENT/EYES = negative for loss of vision, double vision, blurred vision,  sore throa Cardiovascular= negative for chest pain, palpitation Respiratory/lungs= negative for shortness of breath, cough, wheezing; hemoptysis,  Gastrointestinal= negative for nausea, vomiting, abdominal pain Genitourinary= negative for Dysuria MSK = Negative for arthralgia, myalgias Neurology= Negative for headache, numbness, tingling  Psychiatry= Negative for suicidal and homocidal ideation Skin= Negative for Rash   Examination: Constitutional: Not in acute distress, 13 L high flow nasal cannula Respiratory: Bibasilar rhonchi Cardiovascular: Normal sinus rhythm, no rubs Abdomen: Nontender nondistended good bowel sounds Musculoskeletal: No edema noted Skin: No rashes seen Neurologic: CN 2-12 grossly intact.  And nonfocal Psychiatric: Normal judgment and insight. Alert and oriented x 3. Normal mood.    Objective: Vitals:   12/04/19 0300 12/04/19 0400 12/04/19 0500 12/04/19 0718  BP:  117/71  117/69  Pulse: (!) 49 (!) 50 (!) 51 (!) 52  Resp: (!) 27 (!) 23 (!) 25 (!) 28  Temp:  97.9 F (36.6 C)  98.5 F (36.9 C)  TempSrc:  Oral  Oral  SpO2: (!) 88% 93% (!) 89% (!) 88%  Weight:      Height:        Intake/Output Summary (Last 24 hours) at 12/04/2019 0813 Last data filed at 12/04/2019 0700 Gross per 24 hour  Intake 840 ml  Output 2550 ml  Net -1710 ml   Filed Weights   11/29/19 1600  Weight: 92.5 kg     Data Reviewed:   CBC: Recent Labs  Lab 11/29/19 0726 11/29/19 0726  11/30/19 0341 12/01/19 0138 12/02/19 0347 12/03/19 0145 12/04/19 0145  WBC 7.8   < > 7.7 11.0* 8.7 9.0 12.0*  NEUTROABS 6.7  --  6.3 9.6* 7.2 7.2  --   HGB 15.3   < > 14.2 13.8 13.9 13.7 14.4  HCT 45.1   < > 43.0 41.2 41.1 42.1 43.2  MCV 87.1   < > 88.3 88.4 89.2 89.4 88.9  PLT 164   < > 199 242 272 288 324   < > = values in this interval not displayed.   Basic Metabolic Panel: Recent Labs  Lab 11/30/19 0341 12/01/19 0138 12/02/19 0347 12/03/19 0145 12/04/19 0145  NA 138 140 143 141 140  K 4.1 3.9 4.2 4.4 4.2  CL 102 104 106 104 102  CO2 28 26 27 27 25   GLUCOSE 128* 131* 96 99 87  BUN 20 28* 24* 24* 28*  CREATININE 0.85 0.88 0.85 0.79 0.85  CALCIUM 8.6* 8.7* 8.3* 8.3* 8.5*  MG 2.3 2.2 2.1  --  2.1  PHOS 3.5 3.4 3.9  --   --    GFR: Estimated Creatinine Clearance: 95.6 mL/min (by C-G formula based on SCr of 0.85 mg/dL). Liver Function Tests: Recent Labs  Lab 11/29/19 0726 11/30/19 0341 12/01/19 0138 12/02/19 0347 12/03/19 0145  AST 69* 65* 56* 54* 74*  ALT 88* 88* 84* 83* 127*  ALKPHOS 45 44 43 42 42  BILITOT 1.2 0.8 0.7 1.0 0.6  PROT 6.9 6.9 6.3* 5.9* 6.0*  ALBUMIN 3.6 3.3* 3.2* 3.0* 3.2*   No results for input(s): LIPASE, AMYLASE in the last 168 hours. No results for input(s): AMMONIA in the last 168 hours. Coagulation Profile: No results for input(s): INR, PROTIME in the last 168 hours. Cardiac Enzymes: No results for input(s): CKTOTAL, CKMB, CKMBINDEX, TROPONINI in the last 168 hours. BNP (last 3 results) No results for input(s): PROBNP in the last 8760 hours. HbA1C: No results for input(s): HGBA1C in the last 72 hours. CBG: No results for input(s): GLUCAP in the last 168 hours. Lipid Profile: No results for input(s): CHOL, HDL, LDLCALC, TRIG, CHOLHDL, LDLDIRECT in the last 72 hours. Thyroid Function Tests: No results for input(s): TSH, T4TOTAL, FREET4, T3FREE, THYROIDAB in the last 72 hours. Anemia Panel: Recent Labs    12/03/19 0145  12/04/19 0145  FERRITIN 878* 778*   Sepsis Labs: Recent Labs  Lab 11/29/19 0726 12/03/19 0145  PROCALCITON <0.10 0.12  LATICACIDVEN 1.4  --     Recent Results (from the past 240 hour(s))  Respiratory Panel by RT PCR (Flu A&B, Covid) - Nasopharyngeal Swab     Status: Abnormal   Collection Time: 11/29/19  7:05 AM   Specimen: Nasopharyngeal Swab  Result Value Ref Range Status   SARS Coronavirus 2 by RT PCR POSITIVE (A) NEGATIVE Final    Comment: RESULT CALLED TO, READ BACK BY AND VERIFIED WITH: CARLIN ON [id number] AT 1004 BY NFIELDS (NOTE) SARS-CoV-2 target nucleic acids are DETECTED. SARS-CoV-2 RNA is generally detectable in upper respiratory specimens  during the acute phase of infection. Positive results are indicative of the presence of the identified virus, but do not rule out bacterial infection or co-infection with other pathogens not detected by the test. Clinical correlation with patient history and other diagnostic information  is necessary to determine patient infection status. The expected result is Negative. Fact Sheet for Patients:  PinkCheek.be Fact Sheet for Healthcare Providers: GravelBags.it This test is not yet approved or cleared by the [place] FDA and  has been authorized for detection and/or diagnosis of SARS-CoV-2 by FDA under an Emergency Use Authorization (EUA).  This EUA will remain in effect (meaning this test can be used) f or the duration of  the COVID-19 declaration under Section 564(b)(1) of the Act, 21 U.S.C. section 360bbb-3(b)(1), unless the authorization is terminated or revoked sooner.    Influenza A by PCR NEGATIVE NEGATIVE Final   Influenza B by PCR NEGATIVE NEGATIVE Final    Comment: (NOTE) The Xpert Xpress SARS-CoV-2/FLU/RSV assay is intended as an aid in  the diagnosis of influenza from Nasopharyngeal swab specimens and  should not be used as a sole basis for treatment.  Nasal washings and  aspirates are unacceptable for Xpert Xpress SARS-CoV-2/FLU/RSV  testing. Fact Sheet for Patients: PinkCheek.be Fact Sheet for Healthcare Providers: GravelBags.it This test is not yet approved or cleared by the [place] FDA and  has been authorized for detection and/or diagnosis of SARS-CoV-2 by  FDA under an Emergency Use Authorization (EUA). This EUA will remain  in effect (meaning this test can be used) for the duration of the  Covid-19 declaration under Section 564(b)(1) of the Act, 21  U.S.C. section 360bbb-3(b)(1), unless the authorization is  terminated or revoked. Performed at [facility], [place] [address] [place], [place] [address]   Blood Culture (routine x 2)     Status: None   Collection Time: 11/29/19  7:30 AM   Specimen: BLOOD  Result Value Ref Range Status   Specimen Description BLOOD RIGHT ANTECUBITAL  Final   Special Requests   Final    BOTTLES DRAWN AEROBIC AND ANAEROBIC Blood Culture adequate volume   Culture   Final    NO GROWTH 5 DAYS Performed at [facility], [place] [address], [place], [place] [address]    Report Status 12/04/2019 FINAL  Final  Blood Culture (routine x 2)     Status: None   Collection Time: 11/29/19  7:55 AM   Specimen: BLOOD RIGHT ARM  Result Value Ref Range Status   Specimen Description BLOOD RIGHT ARM  Final   Special Requests   Final    BOTTLES DRAWN AEROBIC AND ANAEROBIC Blood Culture adequate volume   Culture   Final    NO GROWTH 5 DAYS Performed at [facility], [place] [address] [place], [place] [address]    Report Status 12/04/2019 FINAL  Final      Radiology Studies: DG CHEST PORT 1 VIEW  Result Date: 12/03/2019 CLINICAL DATA:  Pneumonia EXAM: PORTABLE CHEST 1 VIEW COMPARISON:  11/30/2019 FINDINGS: Patchy bilateral airspace disease again noted, slightly worsened since prior study. Heart is borderline in size. No visible  effusions or pneumothorax. No acute bony abnormality. IMPRESSION: Worsening patchy bilateral airspace disease/pneumonia. Electronically Signed   By: Rolm Baptise M.D.   On: 12/03/2019 09:31    Scheduled Meds: . vitamin C  500 mg Oral Daily  . aspirin  81 mg Oral Daily  . azithromycin  500 mg Oral Daily  . chlorpheniramine-HYDROcodone  5 mL Oral Q12H  . dexamethasone (DECADRON) injection  6 mg Intravenous Q24H  . enoxaparin (LOVENOX) injection  40 mg Subcutaneous Q24H  . Ipratropium-Albuterol  1 puff Inhalation Q6H  . zinc sulfate  220 mg Oral  Daily   Continuous Infusions:   LOS: 5 days   Time spent= 35 mins  Rockelle Heuerman Arsenio Loader, MD [facility]  If 7PM-7AM, please contact night-coverage  12/04/2019, 8:13 AM

## 2019-12-04 NOTE — Plan of Care (Signed)
No acute events during this shift. Pt remains on 10L HFNC. VSS     Problem: Education: Goal: Knowledge of risk factors and measures for prevention of condition will improve Outcome: Progressing   Problem: Coping: Goal: Psychosocial and spiritual needs will be supported Outcome: Progressing   Problem: Respiratory: Goal: Will maintain a patent airway Outcome: Progressing Goal: Complications related to the disease process, condition or treatment will be avoided or minimized Outcome: Progressing   Problem: Education: Goal: Knowledge of General Education information will improve Description: Including pain rating scale, medication(s)/side effects and non-pharmacologic comfort measures Outcome: Progressing   Problem: Health Behavior/Discharge Planning: Goal: Ability to manage health-related needs will improve Outcome: Progressing   Problem: Clinical Measurements: Goal: Ability to maintain clinical measurements within normal limits will improve Outcome: Progressing Goal: Will remain free from infection Outcome: Progressing Goal: Diagnostic test results will improve Outcome: Progressing Goal: Respiratory complications will improve Outcome: Progressing Goal: Cardiovascular complication will be avoided Outcome: Progressing   Problem: Activity: Goal: Risk for activity intolerance will decrease Outcome: Progressing   Problem: Nutrition: Goal: Adequate nutrition will be maintained Outcome: Progressing   Problem: Coping: Goal: Level of anxiety will decrease Outcome: Progressing   Problem: Elimination: Goal: Will not experience complications related to bowel motility Outcome: Progressing Goal: Will not experience complications related to urinary retention Outcome: Progressing   Problem: Pain Managment: Goal: General experience of comfort will improve Outcome: Progressing   Problem: Safety: Goal: Ability to remain free from injury will improve Outcome: Progressing    Problem: Skin Integrity: Goal: Risk for impaired skin integrity will decrease Outcome: Progressing

## 2019-12-05 LAB — CBC
HCT: 41.9 % (ref 39.0–52.0)
Hemoglobin: 14.3 g/dL (ref 13.0–17.0)
MCH: 30.2 pg (ref 26.0–34.0)
MCHC: 34.1 g/dL (ref 30.0–36.0)
MCV: 88.4 fL (ref 80.0–100.0)
Platelets: 320 10*3/uL (ref 150–400)
RBC: 4.74 MIL/uL (ref 4.22–5.81)
RDW: 12.3 % (ref 11.5–15.5)
WBC: 11.9 10*3/uL — ABNORMAL HIGH (ref 4.0–10.5)
nRBC: 0 % (ref 0.0–0.2)

## 2019-12-05 LAB — HEPATIC FUNCTION PANEL
ALT: 88 U/L — ABNORMAL HIGH (ref 0–44)
AST: 39 U/L (ref 15–41)
Albumin: 3.1 g/dL — ABNORMAL LOW (ref 3.5–5.0)
Alkaline Phosphatase: 47 U/L (ref 38–126)
Bilirubin, Direct: 0.2 mg/dL (ref 0.0–0.2)
Indirect Bilirubin: 0.3 mg/dL (ref 0.3–0.9)
Total Bilirubin: 0.5 mg/dL (ref 0.3–1.2)
Total Protein: 5.7 g/dL — ABNORMAL LOW (ref 6.5–8.1)

## 2019-12-05 LAB — BASIC METABOLIC PANEL
Anion gap: 10 (ref 5–15)
BUN: 28 mg/dL — ABNORMAL HIGH (ref 8–23)
CO2: 26 mmol/L (ref 22–32)
Calcium: 8.4 mg/dL — ABNORMAL LOW (ref 8.9–10.3)
Chloride: 103 mmol/L (ref 98–111)
Creatinine, Ser: 0.75 mg/dL (ref 0.61–1.24)
GFR calc Af Amer: >60 mL/min (ref >60)
GFR calc non Af Amer: >60 mL/min (ref >60)
Glucose, Bld: 124 mg/dL — ABNORMAL HIGH (ref 70–99)
Potassium: 4.1 mmol/L (ref 3.5–5.1)
Sodium: 139 mmol/L (ref 135–145)

## 2019-12-05 LAB — MAGNESIUM: Magnesium: 2.2 mg/dL (ref 1.7–2.4)

## 2019-12-05 NOTE — Progress Notes (Deleted)
Assessment & Plan:  ***  No follow-ups on file.  Hendricks Limes, MSN, APRN, FNP-C [facility]  Subjective:    Patient ID: Dakota Cooke, male    DOB: Aug 19, 1956, 64 y.o.   MRN: [id number]  Patient Care Team: Loman Brooklyn, FNP as PCP - General (Family Medicine)   Chief Complaint: No chief complaint on file.   HPI: Dakota Cooke is a 63 y.o. male presenting on 12/09/2019 for No chief complaint on file.  Patient was switched from lisinopril to losartan ~6 weeks ago due to lisinopril possibly causing a tickle in his throat.   He was given exercises for his left shoulder pain and encouraged to use muscle rubs and take Tylenol/NSAIDs for the pain.   Patient was hospitalized at [facility] on 11/29/2019 due to COVID-19.   New complaints: ***  Social history:  Relevant past medical, surgical, family and social history reviewed and updated as indicated. Interim medical history since our last visit reviewed.  Allergies and medications reviewed and updated.  DATA REVIEWED: CHART IN EPIC  ROS: Negative unless specifically indicated above in HPI.   No current facility-administered medications for this visit. No current outpatient medications on file.  Facility-Administered Medications Ordered in Other Visits:  .  acetaminophen (TYLENOL) tablet 650 mg, 650 mg, Oral, Q6H PRN, Karmen Bongo, MD, 650 mg at 11/29/19 1703 .  albuterol (VENTOLIN HFA) 108 (90 Base) MCG/ACT inhaler 2 puff, 2 puff, Inhalation, Q2H PRN, Karmen Bongo, MD .  ascorbic acid (VITAMIN C) tablet 500 mg, 500 mg, Oral, Daily, Verlon Au, Jai-Gurmukh, MD, 500 mg at 12/05/19 0914 .  aspirin EC tablet 81 mg, 81 mg, Oral, Daily, Karmen Bongo, MD, 81 mg at 12/05/19 0915 .  azithromycin (ZITHROMAX) tablet 500 mg, 500 mg, Oral, Daily, Amin, Ankit Chirag, MD, 500 mg at 12/05/19 0915 .  bisacodyl (DULCOLAX) EC tablet 5 mg, 5 mg, Oral, Daily PRN, Karmen Bongo, MD .   chlorpheniramine-HYDROcodone (TUSSIONEX) 10-8 MG/5ML suspension 5 mL, 5 mL, Oral, Q12H PRN, Karmen Bongo, MD .  chlorpheniramine-HYDROcodone (TUSSIONEX) 10-8 MG/5ML suspension 5 mL, 5 mL, Oral, Q12H, Nita Sells, MD, 5 mL at 12/05/19 0917 .  dexamethasone (DECADRON) injection 6 mg, 6 mg, Intravenous, Q24H, Karmen Bongo, MD, 6 mg at 12/05/19 0915 .  enoxaparin (LOVENOX) injection 40 mg, 40 mg, Subcutaneous, Q24H, Karmen Bongo, MD, 40 mg at 12/05/19 0914 .  guaiFENesin-dextromethorphan (ROBITUSSIN DM) 100-10 MG/5ML syrup 10 mL, 10 mL, Oral, Q4H PRN, Karmen Bongo, MD .  hydrALAZINE (APRESOLINE) tablet 10 mg, 10 mg, Oral, Q6H PRN, Verlon Au, Jai-Gurmukh, MD .  Ipratropium-Albuterol (COMBIVENT) respimat 1 puff, 1 puff, Inhalation, Q6H, Amin, Ankit Chirag, MD, 1 puff at 12/05/19 1456 .  ondansetron (ZOFRAN) tablet 4 mg, 4 mg, Oral, Q6H PRN **OR** ondansetron (ZOFRAN) injection 4 mg, 4 mg, Intravenous, Q6H PRN, Karmen Bongo, MD .  oxyCODONE (Oxy IR/ROXICODONE) immediate release tablet 5 mg, 5 mg, Oral, Q4H PRN, Karmen Bongo, MD .  polyethylene glycol (MIRALAX / GLYCOLAX) packet 17 g, 17 g, Oral, Daily PRN, Karmen Bongo, MD .  sodium phosphate (FLEET) 7-19 GM/118ML enema 1 enema, 1 enema, Rectal, Once PRN, Karmen Bongo, MD .  zinc sulfate capsule 220 mg, 220 mg, Oral, Daily, Verlon Au, Jai-Gurmukh, MD, 220 mg at 12/05/19 0914   No Known Allergies Past Medical History:  Diagnosis Date  . BPH (benign prostatic hyperplasia)   . Hyperlipidemia 10/15/2019  . Hypertension     Past Surgical History:  Procedure Laterality Date  .  BACK SURGERY  1991   lower thoracic spine fusion to L3 level    Social History   Socioeconomic History  . Marital status: Married    Spouse name: Not on file  . Number of children: Not on file  . Years of education: Not on file  . Highest education level: Not on file  Occupational History  . Not on file  Tobacco Use  . Smoking status: Former  Smoker    Types: Cigarettes    Quit date: 1989    Years since quitting: 32.1  . Smokeless tobacco: Never Used  Substance and Sexual Activity  . Alcohol use: Yes    Comment: occ  . Drug use: Never  . Sexual activity: Not on file  Other Topics Concern  . Not on file  Social History Narrative  . Not on file   Social Determinants of Health   Financial Resource Strain:   . Difficulty of Paying Living Expenses: Not on file  Food Insecurity:   . Worried About Charity fundraiser in the Last Year: Not on file  . Ran Out of Food in the Last Year: Not on file  Transportation Needs:   . Lack of Transportation (Medical): Not on file  . Lack of Transportation (Non-Medical): Not on file  Physical Activity:   . Days of Exercise per Week: Not on file  . Minutes of Exercise per Session: Not on file  Stress:   . Feeling of Stress : Not on file  Social Connections:   . Frequency of Communication with Friends and Family: Not on file  . Frequency of Social Gatherings with Friends and Family: Not on file  . Attends Religious Services: Not on file  . Active Member of Clubs or Organizations: Not on file  . Attends Archivist Meetings: Not on file  . Marital Status: Not on file  Intimate Partner Violence:   . Fear of Current or Ex-Partner: Not on file  . Emotionally Abused: Not on file  . Physically Abused: Not on file  . Sexually Abused: Not on file        Objective:    There were no vitals taken for this visit.  Physical Exam  No results found for: TSH Lab Results  Component Value Date   WBC 11.9 (H) 12/05/2019   HGB 14.3 12/05/2019   HCT 41.9 12/05/2019   MCV 88.4 12/05/2019   PLT 320 12/05/2019   Lab Results  Component Value Date   NA 139 12/05/2019   K 4.1 12/05/2019   CO2 26 12/05/2019   GLUCOSE 124 (H) 12/05/2019   BUN 28 (H) 12/05/2019   CREATININE 0.75 12/05/2019   BILITOT 0.5 12/05/2019   ALKPHOS 47 12/05/2019   AST 39 12/05/2019   ALT 88 (H)  12/05/2019   PROT 5.7 (L) 12/05/2019   ALBUMIN 3.1 (L) 12/05/2019   CALCIUM 8.4 (L) 12/05/2019   ANIONGAP 10 12/05/2019   Lab Results  Component Value Date   CHOL 214 (H) 10/14/2019   Lab Results  Component Value Date   HDL 34 (L) 10/14/2019   Lab Results  Component Value Date   LDLCALC 133 (H) 10/14/2019   Lab Results  Component Value Date   TRIG 72 11/29/2019   Lab Results  Component Value Date   CHOLHDL 6.3 (H) 10/14/2019   Lab Results  Component Value Date   HGBA1C 5.7 10/14/2019

## 2019-12-05 NOTE — Progress Notes (Signed)
PROGRESS NOTE    Dakota Cooke  [id number] DOB: 06/11/1956 DOA: 11/29/2019 PCP: Loman Brooklyn, FNP   Brief Narrative:  64 year old with history of HTN, prediabetes, HLD, BPH recent loss of mother and travel to [place] where he contracted COVID-19 pneumonia.  Admitted to the hospital on 2/13 for worsening hypoxic respiratory failure.  Started on steroids, remdesivir, received a dose of Actemra.  Oxygen levels were slowly being weaned.   Assessment & Plan:   Principal Problem:   Acute respiratory disease due to COVID-19 virus Active Problems:   Hypertension   Hyperlipidemia   BPH (benign prostatic hyperplasia)  Acute hypoxic respiratory failure secondary to COVID-19 pneumonia; persistent. Slow to improve.  -Oxygen levels-Still on 6L HF [place].  Dyspnea on exertion. Slow to improve. No on Home O2.  -Remdesivir-Completed 2/18 -Decadrone-day 6 -Actemra- 2/13 -Convalescent plasma- None -Antibiotics- Azithromycin 500mg  po Daily. Stop after tomorrow. Complete 7 days.  -procalcitonin- Neg (on admission), Neg (2/17) -BNP 42 (2/17) -Lasix - 40 mg IV (2/17), -1.7 L -Chest x-ray- B/l Infiltrates  -Supportive care-antitussive, inhalers, I-S/flutter -CODE STATUS confirmed -Vitamin C & Zinc. Prone >16hrs/day.  -Routine: Labs have been reviewed including ferritin, LDH, CRP, d-dimer, fibrinogen.  Will need to trend this lab daily.  Transaminitis -Unknown exact etiology.  Possibly medication related but will continue to trend this for now.  If necessary will pursue further work-up. Add LFTs to today's lab.  -Hold atorvastatin  Essential hypertension -Cont to Hold Losartan.   Depression -Unsure if this is chronic diagnosis.  Likely grieving from recent family member loss  BPH -None  DVT prophylaxis: Lovenox Code Status: Full code Family Communication:  Spoke with his wife.  Disposition Plan:   Patient From= home  Patient Anticipated D/C place= Home  Barriers= significantly  hypoxic requiring high flow nasal cannula.  Not on home oxygen, unsafe for discharge.  Will discharge once weaned off oxygen.  Suspect he will still be here in the hospital for next 2-4 days if not longer.  Subjective: Feels better. "Feels his lungs are more open" No other complaints.   Review of Systems Otherwise negative except as per HPI, including: General = no fevers, chills, dizziness,  fatigue HEENT/EYES = negative for loss of vision, double vision, blurred vision,  sore throa Cardiovascular= negative for chest pain, palpitation Respiratory/lungs= negative for hemoptysis,  Gastrointestinal= negative for nausea, vomiting, abdominal pain Genitourinary= negative for Dysuria MSK = Negative for arthralgia, myalgias Neurology= Negative for headache, numbness, tingling  Psychiatry= Negative for suicidal and homocidal ideation Skin= Negative for Rash  Examination: Constitutional: Not in acute distress; 6L [place] Respiratory: bibasilar rhonchi Cardiovascular: Normal sinus rhythm, no rubs Abdomen: Nontender nondistended good bowel sounds Musculoskeletal: No edema noted Skin: No rashes seen Neurologic: CN 2-12 grossly intact.  And nonfocal Psychiatric: Normal judgment and insight. Alert and oriented x 3. Normal mood.     Objective: Vitals:   12/05/19 0223 12/05/19 0224 12/05/19 0452 12/05/19 0727  BP: 112/60  118/62 125/65  Pulse: (!) 46 (!) 47 (!) 50 (!) 52  Resp: (!) 24 (!) 22 (!) 21 20  Temp: 98.6 F (37 C)  98.2 F (36.8 C) 98.2 F (36.8 C)  TempSrc: Oral  Oral Oral  SpO2: 91% 91% 93% 91%  Weight:      Height:        Intake/Output Summary (Last 24 hours) at 12/05/2019 0828 Last data filed at 12/05/2019 0700 Gross per 24 hour  Intake 470 ml  Output 725 ml  Net -255  ml   Filed Weights   11/29/19 1600  Weight: 92.5 kg     Data Reviewed:   CBC: Recent Labs  Lab 11/29/19 0726 11/29/19 0726 11/30/19 0341 11/30/19 0341 12/01/19 0138 12/02/19 0347 12/03/19 0145  12/04/19 0145 12/05/19 0016  WBC 7.8   < > 7.7   < > 11.0* 8.7 9.0 12.0* 11.9*  NEUTROABS 6.7  --  6.3  --  9.6* 7.2 7.2  --   --   HGB 15.3   < > 14.2   < > 13.8 13.9 13.7 14.4 14.3  HCT 45.1   < > 43.0   < > 41.2 41.1 42.1 43.2 41.9  MCV 87.1   < > 88.3   < > 88.4 89.2 89.4 88.9 88.4  PLT 164   < > 199   < > 242 272 288 324 320   < > = values in this interval not displayed.   Basic Metabolic Panel: Recent Labs  Lab 11/30/19 0341 11/30/19 0341 12/01/19 0138 12/02/19 0347 12/03/19 0145 12/04/19 0145 12/05/19 0016  NA 138   < > 140 143 141 140 139  K 4.1   < > 3.9 4.2 4.4 4.2 4.1  CL 102   < > 104 106 104 102 103  CO2 28   < > 26 27 27 25 26   GLUCOSE 128*   < > 131* 96 99 87 124*  BUN 20   < > 28* 24* 24* 28* 28*  CREATININE 0.85   < > 0.88 0.85 0.79 0.85 0.75  CALCIUM 8.6*   < > 8.7* 8.3* 8.3* 8.5* 8.4*  MG 2.3  --  2.2 2.1  --  2.1 2.2  PHOS 3.5  --  3.4 3.9  --   --   --    < > = values in this interval not displayed.   GFR: Estimated Creatinine Clearance: 101.6 mL/min (by C-G formula based on SCr of 0.75 mg/dL). Liver Function Tests: Recent Labs  Lab 11/29/19 0726 11/30/19 0341 12/01/19 0138 12/02/19 0347 12/03/19 0145  AST 69* 65* 56* 54* 74*  ALT 88* 88* 84* 83* 127*  ALKPHOS 45 44 43 42 42  BILITOT 1.2 0.8 0.7 1.0 0.6  PROT 6.9 6.9 6.3* 5.9* 6.0*  ALBUMIN 3.6 3.3* 3.2* 3.0* 3.2*   No results for input(s): LIPASE, AMYLASE in the last 168 hours. No results for input(s): AMMONIA in the last 168 hours. Coagulation Profile: No results for input(s): INR, PROTIME in the last 168 hours. Cardiac Enzymes: No results for input(s): CKTOTAL, CKMB, CKMBINDEX, TROPONINI in the last 168 hours. BNP (last 3 results) No results for input(s): PROBNP in the last 8760 hours. HbA1C: No results for input(s): HGBA1C in the last 72 hours. CBG: No results for input(s): GLUCAP in the last 168 hours. Lipid Profile: No results for input(s): CHOL, HDL, LDLCALC, TRIG, CHOLHDL,  LDLDIRECT in the last 72 hours. Thyroid Function Tests: No results for input(s): TSH, T4TOTAL, FREET4, T3FREE, THYROIDAB in the last 72 hours. Anemia Panel: Recent Labs    12/03/19 0145 12/04/19 0145  FERRITIN 878* [id number]*   Sepsis Labs: Recent Labs  Lab 11/29/19 0726 12/03/19 0145  PROCALCITON <0.10 0.12  LATICACIDVEN 1.4  --     Recent Results (from the past 240 hour(s))  Respiratory Panel by RT PCR (Flu A&B, Covid) - Nasopharyngeal Swab     Status: Abnormal   Collection Time: 11/29/19  7:05 AM   Specimen: Nasopharyngeal Swab  Result Value Ref  Range Status   SARS Coronavirus 2 by RT PCR POSITIVE (A) NEGATIVE Final    Comment: RESULT CALLED TO, READ BACK BY AND VERIFIED WITH: CARLIN ON [id number] AT 1004 BY NFIELDS (NOTE) SARS-CoV-2 target nucleic acids are DETECTED. SARS-CoV-2 RNA is generally detectable in upper respiratory specimens  during the acute phase of infection. Positive results are indicative of the presence of the identified virus, but do not rule out bacterial infection or co-infection with other pathogens not detected by the test. Clinical correlation with patient history and other diagnostic information is necessary to determine patient infection status. The expected result is Negative. Fact Sheet for Patients:  PinkCheek.be Fact Sheet for Healthcare Providers: GravelBags.it This test is not yet approved or cleared by the [place] FDA and  has been authorized for detection and/or diagnosis of SARS-CoV-2 by FDA under an Emergency Use Authorization (EUA).  This EUA will remain in effect (meaning this test can be used) f or the duration of  the COVID-19 declaration under Section 564(b)(1) of the Act, 21 U.S.C. section 360bbb-3(b)(1), unless the authorization is terminated or revoked sooner.    Influenza A by PCR NEGATIVE NEGATIVE Final   Influenza B by PCR NEGATIVE NEGATIVE Final    Comment:  (NOTE) The Xpert Xpress SARS-CoV-2/FLU/RSV assay is intended as an aid in  the diagnosis of influenza from Nasopharyngeal swab specimens and  should not be used as a sole basis for treatment. Nasal washings and  aspirates are unacceptable for Xpert Xpress SARS-CoV-2/FLU/RSV  testing. Fact Sheet for Patients: PinkCheek.be Fact Sheet for Healthcare Providers: GravelBags.it This test is not yet approved or cleared by the [place] FDA and  has been authorized for detection and/or diagnosis of SARS-CoV-2 by  FDA under an Emergency Use Authorization (EUA). This EUA will remain  in effect (meaning this test can be used) for the duration of the  Covid-19 declaration under Section 564(b)(1) of the Act, 21  U.S.C. section 360bbb-3(b)(1), unless the authorization is  terminated or revoked. Performed at [facility], [place] [address] [place], [place] [address]   Blood Culture (routine x 2)     Status: None   Collection Time: 11/29/19  7:30 AM   Specimen: BLOOD  Result Value Ref Range Status   Specimen Description BLOOD RIGHT ANTECUBITAL  Final   Special Requests   Final    BOTTLES DRAWN AEROBIC AND ANAEROBIC Blood Culture adequate volume   Culture   Final    NO GROWTH 5 DAYS Performed at [facility], [place] [address], [place], [place] [address]    Report Status 12/04/2019 FINAL  Final  Blood Culture (routine x 2)     Status: None   Collection Time: 11/29/19  7:55 AM   Specimen: BLOOD RIGHT ARM  Result Value Ref Range Status   Specimen Description BLOOD RIGHT ARM  Final   Special Requests   Final    BOTTLES DRAWN AEROBIC AND ANAEROBIC Blood Culture adequate volume   Culture   Final    NO GROWTH 5 DAYS Performed at [facility], [place] [address] [place], [place] [address]    Report Status 12/04/2019 FINAL  Final      Radiology Studies: No results found.  Scheduled Meds: . vitamin C  500 mg Oral Daily  .  aspirin  81 mg Oral Daily  . azithromycin  500 mg Oral Daily  . chlorpheniramine-HYDROcodone  5 mL Oral Q12H  . dexamethasone (DECADRON) injection  6 mg  Intravenous Q24H  . enoxaparin (LOVENOX) injection  40 mg Subcutaneous Q24H  . Ipratropium-Albuterol  1 puff Inhalation Q6H  . zinc sulfate  220 mg Oral Daily   Continuous Infusions:   LOS: 6 days   Time spent= 35 mins  Easten Maceachern Arsenio Loader, MD [facility]  If 7PM-7AM, please contact night-coverage  12/05/2019, 8:28 AM

## 2019-12-06 LAB — BASIC METABOLIC PANEL
Anion gap: 11 (ref 5–15)
BUN: 25 mg/dL — ABNORMAL HIGH (ref 8–23)
CO2: 25 mmol/L (ref 22–32)
Calcium: 8.8 mg/dL — ABNORMAL LOW (ref 8.9–10.3)
Chloride: 104 mmol/L (ref 98–111)
Creatinine, Ser: 0.65 mg/dL (ref 0.61–1.24)
GFR calc Af Amer: >60 mL/min (ref >60)
GFR calc non Af Amer: >60 mL/min (ref >60)
Glucose, Bld: 104 mg/dL — ABNORMAL HIGH (ref 70–99)
Potassium: 5 mmol/L (ref 3.5–5.1)
Sodium: 140 mmol/L (ref 135–145)

## 2019-12-06 LAB — CBC
HCT: 43.4 % (ref 39.0–52.0)
Hemoglobin: 14.7 g/dL (ref 13.0–17.0)
MCH: 29.9 pg (ref 26.0–34.0)
MCHC: 33.9 g/dL (ref 30.0–36.0)
MCV: 88.2 fL (ref 80.0–100.0)
Platelets: 339 10*3/uL (ref 150–400)
RBC: 4.92 MIL/uL (ref 4.22–5.81)
RDW: 12.6 % (ref 11.5–15.5)
WBC: 17.6 10*3/uL — ABNORMAL HIGH (ref 4.0–10.5)
nRBC: 0 % (ref 0.0–0.2)

## 2019-12-06 LAB — MAGNESIUM: Magnesium: 2.2 mg/dL (ref 1.7–2.4)

## 2019-12-06 NOTE — Progress Notes (Signed)
PROGRESS NOTE    Dakota Cooke  [id number] DOB: [birth year] DOA: 11/29/2019 PCP: Loman Brooklyn, FNP   Brief Narrative:  64 year old with history of HTN, prediabetes, HLD, BPH recent loss of mother and travel to [place] where he contracted COVID-19 pneumonia.  Admitted to the hospital on 2/13 for worsening hypoxic respiratory failure.  Started on steroids, remdesivir, received a dose of Actemra.  Oxygen levels were slowly being weaned.   Assessment & Plan:   Principal Problem:   Acute respiratory disease due to COVID-19 virus Active Problems:   Hypertension   Hyperlipidemia   BPH (benign prostatic hyperplasia)  Acute hypoxic respiratory failure secondary to COVID-19 pneumonia; persistent. Slow to improve.  -Oxygen levels-still on 4L HF [place]. Not on home O2.  -Remdesivir-Completed 2/18 -Decadrone-day 7 -Actemra- 2/13 -Convalescent plasma- None -Antibiotics- Azithromycin 500mg  po Daily. Stop after tomorrow. Complete 7 days.  -procalcitonin- Neg (on admission), Neg (2/17) -BNP 42 (2/17) -Lasix - 40 mg IV (2/17), -1.7 L -Chest x-ray- B/l Infiltrates  -Supportive care-antitussive, inhalers, I-S/flutter -CODE STATUS confirmed -Vitamin C & Zinc. Prone >16hrs/day.  -Routine: Labs have been reviewed including ferritin, LDH, CRP, d-dimer, fibrinogen.  Will need to trend this lab daily.  Transaminitis; Improved.  -Unknown exact etiology.  Possibly medication related but will continue to trend this for now.  If necessary will pursue further work-up. Add LFTs to today's lab.  -Hold atorvastatin  Essential hypertension -Cont to Hold Losartan.   Depression -Unsure if this is chronic diagnosis.  Likely grieving from recent family member loss  BPH -None  DVT prophylaxis: Lovenox Code Status: Full code  Family Communication:  No answer, left his wife a voicemail Disposition Plan:   Patient From= home  Patient Anticipated D/C place= Home  Barriers= significantly hypoxic  requiring high flow nasal cannula.  Not on home oxygen, unsafe for discharge.  Will discharge once weaned off oxygen.  Suspect he will still be here in the hospital for next 2-4 days if not longer.  Subjective: Feels little better. Able to wean down O2 to 4L HF while I was in the room.  Less coughing and congestion.   Review of Systems Otherwise negative except as per HPI, including: General = no fevers, chills, dizziness,  fatigue HEENT/EYES = negative for loss of vision, double vision, blurred vision,  sore throa Cardiovascular= negative for chest pain, palpitation Respiratory/lungs= negativewheezing; hemoptysis,  Gastrointestinal= negative for nausea, vomiting, abdominal pain Genitourinary= negative for Dysuria MSK = Negative for arthralgia, myalgias Neurology= Negative for headache, numbness, tingling  Psychiatry= Negative for suicidal and homocidal ideation Skin= Negative for Rash  Examination: Constitutional: Not in acute distress; 4L HF [place] Respiratory: mild b/l Rhonchi at the bases.  Cardiovascular: Normal sinus rhythm, no rubs Abdomen: Nontender nondistended good bowel sounds Musculoskeletal: No edema noted Skin: No rashes seen Neurologic: CN 2-12 grossly intact.  And nonfocal Psychiatric: Normal judgment and insight. Alert and oriented x 3. Normal mood.      Objective: Vitals:   12/06/19 0000 12/06/19 0519 12/06/19 0521 12/06/19 0800  BP: 132/69 120/62  137/76  Pulse: (!) 51 (!) 49 (!) 50 (!) 58  Resp: 20 20 20 18   Temp: 98.9 F (37.2 C) 98.7 F (37.1 C)  99 F (37.2 C)  TempSrc: Oral Oral  Oral  SpO2: (!) 88% 94% 94% 93%  Weight:      Height:        Intake/Output Summary (Last 24 hours) at 12/06/2019 1127 Last data filed at 12/06/2019 0800 Gross per  24 hour  Intake 657 ml  Output 650 ml  Net 7 ml   Filed Weights   11/29/19 1600  Weight: 92.5 kg     Data Reviewed:   CBC: Recent Labs  Lab 11/30/19 0341 11/30/19 0341 12/01/19 0138  12/01/19 0138 12/02/19 0347 12/03/19 0145 12/04/19 0145 12/05/19 0016 12/06/19 0626  WBC 7.7   < > 11.0*   < > 8.7 9.0 12.0* 11.9* 17.6*  NEUTROABS 6.3  --  9.6*  --  7.2 7.2  --   --   --   HGB 14.2   < > 13.8   < > 13.9 13.7 14.4 14.3 14.7  HCT 43.0   < > 41.2   < > 41.1 42.1 43.2 41.9 43.4  MCV 88.3   < > 88.4   < > 89.2 89.4 88.9 88.4 88.2  PLT 199   < > 242   < > 272 288 324 320 339   < > = values in this interval not displayed.   Basic Metabolic Panel: Recent Labs  Lab 11/30/19 0341 11/30/19 0341 12/01/19 0138 12/01/19 0138 12/02/19 0347 12/03/19 0145 12/04/19 0145 12/05/19 0016 12/06/19 0626  NA 138   < > 140   < > 143 141 140 139 140  K 4.1   < > 3.9   < > 4.2 4.4 4.2 4.1 5.0  CL 102   < > 104   < > 106 104 102 103 104  CO2 28   < > 26   < > 27 27 25 26 25   GLUCOSE 128*   < > 131*   < > 96 99 87 124* 104*  BUN 20   < > 28*   < > 24* 24* 28* 28* 25*  CREATININE 0.85   < > 0.88   < > 0.85 0.79 0.85 0.75 0.65  CALCIUM 8.6*   < > 8.7*   < > 8.3* 8.3* 8.5* 8.4* 8.8*  MG 2.3   < > 2.2  --  2.1  --  2.1 2.2 2.2  PHOS 3.5  --  3.4  --  3.9  --   --   --   --    < > = values in this interval not displayed.   GFR: Estimated Creatinine Clearance: 101.6 mL/min (by C-G formula based on SCr of 0.65 mg/dL). Liver Function Tests: Recent Labs  Lab 11/30/19 0341 12/01/19 0138 12/02/19 0347 12/03/19 0145 12/05/19 0140  AST 65* 56* 54* 74* 39  ALT 88* 84* 83* 127* 88*  ALKPHOS 44 43 42 42 47  BILITOT 0.8 0.7 1.0 0.6 0.5  PROT 6.9 6.3* 5.9* 6.0* 5.7*  ALBUMIN 3.3* 3.2* 3.0* 3.2* 3.1*   No results for input(s): LIPASE, AMYLASE in the last 168 hours. No results for input(s): AMMONIA in the last 168 hours. Coagulation Profile: No results for input(s): INR, PROTIME in the last 168 hours. Cardiac Enzymes: No results for input(s): CKTOTAL, CKMB, CKMBINDEX, TROPONINI in the last 168 hours. BNP (last 3 results) No results for input(s): PROBNP in the last 8760  hours. HbA1C: No results for input(s): HGBA1C in the last 72 hours. CBG: No results for input(s): GLUCAP in the last 168 hours. Lipid Profile: No results for input(s): CHOL, HDL, LDLCALC, TRIG, CHOLHDL, LDLDIRECT in the last 72 hours. Thyroid Function Tests: No results for input(s): TSH, T4TOTAL, FREET4, T3FREE, THYROIDAB in the last 72 hours. Anemia Panel: Recent Labs    12/04/19 0145  FERRITIN 778*  Sepsis Labs: Recent Labs  Lab 12/03/19 0145  PROCALCITON 0.12    Recent Results (from the past 240 hour(s))  Respiratory Panel by RT PCR (Flu A&B, Covid) - Nasopharyngeal Swab     Status: Abnormal   Collection Time: 11/29/19  7:05 AM   Specimen: Nasopharyngeal Swab  Result Value Ref Range Status   SARS Coronavirus 2 by RT PCR POSITIVE (A) NEGATIVE Final    Comment: RESULT CALLED TO, READ BACK BY AND VERIFIED WITH: CARLIN ON [id number] AT 1004 BY NFIELDS (NOTE) SARS-CoV-2 target nucleic acids are DETECTED. SARS-CoV-2 RNA is generally detectable in upper respiratory specimens  during the acute phase of infection. Positive results are indicative of the presence of the identified virus, but do not rule out bacterial infection or co-infection with other pathogens not detected by the test. Clinical correlation with patient history and other diagnostic information is necessary to determine patient infection status. The expected result is Negative. Fact Sheet for Patients:  PinkCheek.be Fact Sheet for Healthcare Providers: GravelBags.it This test is not yet approved or cleared by the [place] FDA and  has been authorized for detection and/or diagnosis of SARS-CoV-2 by FDA under an Emergency Use Authorization (EUA).  This EUA will remain in effect (meaning this test can be used) f or the duration of  the COVID-19 declaration under Section 564(b)(1) of the Act, 21 U.S.C. section 360bbb-3(b)(1), unless the authorization  is terminated or revoked sooner.    Influenza A by PCR NEGATIVE NEGATIVE Final   Influenza B by PCR NEGATIVE NEGATIVE Final    Comment: (NOTE) The Xpert Xpress SARS-CoV-2/FLU/RSV assay is intended as an aid in  the diagnosis of influenza from Nasopharyngeal swab specimens and  should not be used as a sole basis for treatment. Nasal washings and  aspirates are unacceptable for Xpert Xpress SARS-CoV-2/FLU/RSV  testing. Fact Sheet for Patients: PinkCheek.be Fact Sheet for Healthcare Providers: GravelBags.it This test is not yet approved or cleared by the [place] FDA and  has been authorized for detection and/or diagnosis of SARS-CoV-2 by  FDA under an Emergency Use Authorization (EUA). This EUA will remain  in effect (meaning this test can be used) for the duration of the  Covid-19 declaration under Section 564(b)(1) of the Act, 21  U.S.C. section 360bbb-3(b)(1), unless the authorization is  terminated or revoked. Performed at [facility], [place] [address], [place], [place] [address]   Blood Culture (routine x 2)     Status: None   Collection Time: 11/29/19  7:30 AM   Specimen: BLOOD  Result Value Ref Range Status   Specimen Description BLOOD RIGHT ANTECUBITAL  Final   Special Requests   Final    BOTTLES DRAWN AEROBIC AND ANAEROBIC Blood Culture adequate volume   Culture   Final    NO GROWTH 5 DAYS Performed at [facility], [place] [address], [place], [place] [address]    Report Status 12/04/2019 FINAL  Final  Blood Culture (routine x 2)     Status: None   Collection Time: 11/29/19  7:55 AM   Specimen: BLOOD RIGHT ARM  Result Value Ref Range Status   Specimen Description BLOOD RIGHT ARM  Final   Special Requests   Final    BOTTLES DRAWN AEROBIC AND ANAEROBIC Blood Culture adequate volume   Culture   Final    NO GROWTH 5 DAYS Performed at [facility], [place] [address] [place], [place] [address]     Report Status 12/04/2019  FINAL  Final      Radiology Studies: No results found.  Scheduled Meds: . vitamin C  500 mg Oral Daily  . aspirin  81 mg Oral Daily  . chlorpheniramine-HYDROcodone  5 mL Oral Q12H  . dexamethasone (DECADRON) injection  6 mg Intravenous Q24H  . enoxaparin (LOVENOX) injection  40 mg Subcutaneous Q24H  . Ipratropium-Albuterol  1 puff Inhalation Q6H  . zinc sulfate  220 mg Oral Daily   Continuous Infusions:   LOS: 7 days   Time spent= 35 mins  Taysean Wager Arsenio Loader, MD [facility]  If 7PM-7AM, please contact night-coverage  12/06/2019, 11:27 AM

## 2019-12-06 NOTE — Progress Notes (Signed)
Family Update  Patient wife called, no answer, nurse left message to return call.

## 2019-12-07 LAB — BASIC METABOLIC PANEL
Anion gap: 11 (ref 5–15)
BUN: 22 mg/dL (ref 8–23)
CO2: 25 mmol/L (ref 22–32)
Calcium: 8.6 mg/dL — ABNORMAL LOW (ref 8.9–10.3)
Chloride: 101 mmol/L (ref 98–111)
Creatinine, Ser: 0.78 mg/dL (ref 0.61–1.24)
GFR calc Af Amer: >60 mL/min (ref >60)
GFR calc non Af Amer: >60 mL/min (ref >60)
Glucose, Bld: 116 mg/dL — ABNORMAL HIGH (ref 70–99)
Potassium: 4.7 mmol/L (ref 3.5–5.1)
Sodium: 137 mmol/L (ref 135–145)

## 2019-12-07 LAB — CBC
HCT: 44.1 % (ref 39.0–52.0)
Hemoglobin: 14.5 g/dL (ref 13.0–17.0)
MCH: 29.7 pg (ref 26.0–34.0)
MCHC: 32.9 g/dL (ref 30.0–36.0)
MCV: 90.4 fL (ref 80.0–100.0)
Platelets: 343 10*3/uL (ref 150–400)
RBC: 4.88 MIL/uL (ref 4.22–5.81)
RDW: 13 % (ref 11.5–15.5)
WBC: 18.6 10*3/uL — ABNORMAL HIGH (ref 4.0–10.5)
nRBC: 0.1 % (ref 0.0–0.2)

## 2019-12-07 LAB — MAGNESIUM: Magnesium: 2.2 mg/dL (ref 1.7–2.4)

## 2019-12-07 LAB — C-REACTIVE PROTEIN: CRP: 1.1 mg/dL — ABNORMAL HIGH (ref <1.0)

## 2019-12-07 LAB — D-DIMER, QUANTITATIVE: D-Dimer, Quant: 0.55 ug/mL-FEU — ABNORMAL HIGH (ref 0.00–0.50)

## 2019-12-07 NOTE — Plan of Care (Signed)
Pt A&Ox4. VSS, SpO2 >88% on 1L [place], continue to wean. No c/o pain throughout shift. Transferring self to [facility] independently w/o issue. Utilizing urinal at bedside appropriately.   Using I/S & flutter valve appropriately  All safety measures in place, will report to oncoming shift.  Dakota Cooke    Problem: Education: Goal: Knowledge of risk factors and measures for prevention of condition will improve Outcome: Progressing   Problem: Coping: Goal: Psychosocial and spiritual needs will be supported Outcome: Progressing   Problem: Respiratory: Goal: Will maintain a patent airway Outcome: Progressing Goal: Complications related to the disease process, condition or treatment will be avoided or minimized Outcome: Progressing   Problem: Education: Goal: Knowledge of General Education information will improve Description: Including pain rating scale, medication(s)/side effects and non-pharmacologic comfort measures Outcome: Progressing   Problem: Health Behavior/Discharge Planning: Goal: Ability to manage health-related needs will improve Outcome: Progressing   Problem: Clinical Measurements: Goal: Ability to maintain clinical measurements within normal limits will improve Outcome: Progressing Goal: Will remain free from infection Outcome: Progressing Goal: Diagnostic test results will improve Outcome: Progressing Goal: Respiratory complications will improve Outcome: Progressing Goal: Cardiovascular complication will be avoided Outcome: Progressing   Problem: Activity: Goal: Risk for activity intolerance will decrease Outcome: Progressing   Problem: Nutrition: Goal: Adequate nutrition will be maintained Outcome: Progressing   Problem: Coping: Goal: Level of anxiety will decrease Outcome: Progressing   Problem: Elimination: Goal: Will not experience complications related to bowel motility Outcome: Progressing Goal: Will not experience complications related to  urinary retention Outcome: Progressing   Problem: Pain Managment: Goal: General experience of comfort will improve Outcome: Progressing   Problem: Safety: Goal: Ability to remain free from injury will improve Outcome: Progressing   Problem: Skin Integrity: Goal: Risk for impaired skin integrity will decrease Outcome: Progressing   Problem: Education: Goal: Utilization of techniques to improve thought processes will improve Outcome: Progressing Goal: Knowledge of the prescribed therapeutic regimen will improve Outcome: Progressing   Problem: Activity: Goal: Interest or engagement in leisure activities will improve Outcome: Progressing Goal: Imbalance in normal sleep/wake cycle will improve Outcome: Progressing   Problem: Coping: Goal: Coping ability will improve Outcome: Progressing Goal: Will verbalize feelings Outcome: Progressing   Problem: Health Behavior/Discharge Planning: Goal: Ability to make decisions will improve Outcome: Progressing Goal: Compliance with therapeutic regimen will improve Outcome: Progressing   Problem: Role Relationship: Goal: Will demonstrate positive changes in social behaviors and relationships Outcome: Progressing   Problem: Safety: Goal: Ability to disclose and discuss suicidal ideas will improve Outcome: Progressing Goal: Ability to identify and utilize support systems that promote safety will improve Outcome: Progressing   Problem: Self-Concept: Goal: Will verbalize positive feelings about self Outcome: Progressing Goal: Level of anxiety will decrease Outcome: Progressing

## 2019-12-07 NOTE — Progress Notes (Signed)
PROGRESS NOTE    Gill Sargis  [id number] DOB: 01-Feb-1956 DOA: 11/29/2019 PCP: Loman Brooklyn, FNP   Brief Narrative:  64 year old with history of HTN, prediabetes, HLD, BPH recent loss of mother and travel to [place] where he contracted COVID-19 pneumonia.  Admitted to the hospital on 2/13 for worsening hypoxic respiratory failure.  Started on steroids, remdesivir, received a dose of Actemra.  Oxygen levels were slowly being weaned.   Assessment & Plan:   Principal Problem:   Acute respiratory disease due to COVID-19 virus Active Problems:   Hypertension   Hyperlipidemia   BPH (benign prostatic hyperplasia)  Acute hypoxic respiratory failure secondary to COVID-19 pneumonia; persistent. Slow to improve.  -Oxygen levels-still remains on 4 L nasal cannula.  Not on home oxygen. -Remdesivir-Completed 2/18 -Decadrone-day 8 -Actemra- 2/13 -Convalescent plasma- None -Antibiotics- Azithromycin 500mg  po Daily. Stop after tomorrow. Complete 7 days.  -procalcitonin- Neg (on admission), Neg (2/17) -BNP 42 (2/17) -Lasix - 40 mg IV (2/17), -1.7 L -Chest x-ray- B/l Infiltrates  -Supportive care-antitussive, inhalers, I-S/flutter -CODE STATUS confirmed -Vitamin C & Zinc. Prone >16hrs/day.  -Routine: Labs have been reviewed including ferritin, LDH, CRP, d-dimer, fibrinogen.  Will need to trend this lab daily.  Transaminitis; Improved.  -Unknown etiology, possible acute illness related. -Mostly resolved.  Essential hypertension -Overall well controlled, off losartan at this time.  Depression -Unsure if this is chronic diagnosis.  Likely grieving from recent family member loss  BPH -None  DVT prophylaxis: Lovenox Code Status: Full code  Family Communication:  Spouse Updated.  Disposition Plan:   Patient From= home  Patient Anticipated D/C place= Home  Barriers= significantly hypoxic requiring high flow nasal cannula.  Not on home oxygen, unsafe for discharge.  Will  discharge once weaned off oxygen.  Suspect he will still be here in the hospital for next 2-4 days if not longer.  Subjective: Tells me he feels great. He is able to do things around in his room with less exertion now. Still requiring 3L [place] with ambulation especially.   Review of Systems Otherwise negative except as per HPI, including: General = no fevers, chills, dizziness,  fatigue HEENT/EYES = negative for loss of vision, double vision, blurred vision,  sore throa Cardiovascular= negative for chest pain, palpitation Respiratory/lungs= negative for hemoptysis,  Gastrointestinal= negative for nausea, vomiting, abdominal pain Genitourinary= negative for Dysuria MSK = Negative for arthralgia, myalgias Neurology= Negative for headache, numbness, tingling  Psychiatry= Negative for suicidal and homocidal ideation Skin= Negative for Rash   Examination: Constitutional: Not in acute distress; 2L  Respiratory: bibasilar crackles.  Cardiovascular: Normal sinus rhythm, no rubs Abdomen: Nontender nondistended good bowel sounds Musculoskeletal: No edema noted Skin: No rashes seen Neurologic: CN 2-12 grossly intact.  And nonfocal Psychiatric: Normal judgment and insight. Alert and oriented x 3. Normal mood.   Objective: Vitals:   12/06/19 2047 12/07/19 0000 12/07/19 0631 12/07/19 0751  BP: 127/81 124/67 117/72 127/75  Pulse: 69 68 (!) 59 64  Resp: 20 20 19  (!) 21  Temp: 98.5 F (36.9 C) 98.5 F (36.9 C) 98.1 F (36.7 C)   TempSrc: Oral Oral Oral   SpO2: 90% 91% 93% (!) 89%  Weight:      Height:        Intake/Output Summary (Last 24 hours) at 12/07/2019 0806 Last data filed at 12/06/2019 1600 Gross per 24 hour  Intake 360 ml  Output 500 ml  Net -140 ml   Filed Weights   11/29/19 1600  Weight: 92.5  kg     Data Reviewed:   CBC: Recent Labs  Lab 12/01/19 0138 12/01/19 0138 12/02/19 0347 12/02/19 0347 12/03/19 0145 12/04/19 0145 12/05/19 0016 12/06/19 0626  12/07/19 0135  WBC 11.0*   < > 8.7   < > 9.0 12.0* 11.9* 17.6* 18.6*  NEUTROABS 9.6*  --  7.2  --  7.2  --   --   --   --   HGB 13.8   < > 13.9   < > 13.7 14.4 14.3 14.7 14.5  HCT 41.2   < > 41.1   < > 42.1 43.2 41.9 43.4 44.1  MCV 88.4   < > 89.2   < > 89.4 88.9 88.4 88.2 90.4  PLT 242   < > 272   < > 288 324 320 339 343   < > = values in this interval not displayed.   Basic Metabolic Panel: Recent Labs  Lab 12/01/19 0138 12/01/19 0138 12/02/19 0347 12/02/19 0347 12/03/19 0145 12/04/19 0145 12/05/19 0016 12/06/19 0626 12/07/19 0135  NA 140   < > 143   < > 141 140 139 140 137  K 3.9   < > 4.2   < > 4.4 4.2 4.1 5.0 4.7  CL 104   < > 106   < > 104 102 103 104 101  CO2 26   < > 27   < > 27 25 26 25 25   GLUCOSE 131*   < > 96   < > 99 87 124* 104* 116*  BUN 28*   < > 24*   < > 24* 28* 28* 25* 22  CREATININE 0.88   < > 0.85   < > 0.79 0.85 0.75 0.65 0.78  CALCIUM 8.7*   < > 8.3*   < > 8.3* 8.5* 8.4* 8.8* 8.6*  MG 2.2   < > 2.1  --   --  2.1 2.2 2.2 2.2  PHOS 3.4  --  3.9  --   --   --   --   --   --    < > = values in this interval not displayed.   GFR: Estimated Creatinine Clearance: 101.6 mL/min (by C-G formula based on SCr of 0.78 mg/dL). Liver Function Tests: Recent Labs  Lab 12/01/19 0138 12/02/19 0347 12/03/19 0145 12/05/19 0140  AST 56* 54* 74* 39  ALT 84* 83* 127* 88*  ALKPHOS 43 42 42 47  BILITOT 0.7 1.0 0.6 0.5  PROT 6.3* 5.9* 6.0* 5.7*  ALBUMIN 3.2* 3.0* 3.2* 3.1*   No results for input(s): LIPASE, AMYLASE in the last 168 hours. No results for input(s): AMMONIA in the last 168 hours. Coagulation Profile: No results for input(s): INR, PROTIME in the last 168 hours. Cardiac Enzymes: No results for input(s): CKTOTAL, CKMB, CKMBINDEX, TROPONINI in the last 168 hours. BNP (last 3 results) No results for input(s): PROBNP in the last 8760 hours. HbA1C: No results for input(s): HGBA1C in the last 72 hours. CBG: No results for input(s): GLUCAP in the last 168  hours. Lipid Profile: No results for input(s): CHOL, HDL, LDLCALC, TRIG, CHOLHDL, LDLDIRECT in the last 72 hours. Thyroid Function Tests: No results for input(s): TSH, T4TOTAL, FREET4, T3FREE, THYROIDAB in the last 72 hours. Anemia Panel: No results for input(s): VITAMINB12, FOLATE, FERRITIN, TIBC, IRON, RETICCTPCT in the last 72 hours. Sepsis Labs: Recent Labs  Lab 12/03/19 0145  PROCALCITON 0.12    Recent Results (from the past 240 hour(s))  Respiratory Panel  by RT PCR (Flu A&B, Covid) - Nasopharyngeal Swab     Status: Abnormal   Collection Time: 11/29/19  7:05 AM   Specimen: Nasopharyngeal Swab  Result Value Ref Range Status   SARS Coronavirus 2 by RT PCR POSITIVE (A) NEGATIVE Final    Comment: RESULT CALLED TO, READ BACK BY AND VERIFIED WITH: CARLIN ON [id number] AT 1004 BY NFIELDS (NOTE) SARS-CoV-2 target nucleic acids are DETECTED. SARS-CoV-2 RNA is generally detectable in upper respiratory specimens  during the acute phase of infection. Positive results are indicative of the presence of the identified virus, but do not rule out bacterial infection or co-infection with other pathogens not detected by the test. Clinical correlation with patient history and other diagnostic information is necessary to determine patient infection status. The expected result is Negative. Fact Sheet for Patients:  PinkCheek.be Fact Sheet for Healthcare Providers: GravelBags.it This test is not yet approved or cleared by the [place] FDA and  has been authorized for detection and/or diagnosis of SARS-CoV-2 by FDA under an Emergency Use Authorization (EUA).  This EUA will remain in effect (meaning this test can be used) f or the duration of  the COVID-19 declaration under Section 564(b)(1) of the Act, 21 U.S.C. section 360bbb-3(b)(1), unless the authorization is terminated or revoked sooner.    Influenza A by PCR NEGATIVE NEGATIVE  Final   Influenza B by PCR NEGATIVE NEGATIVE Final    Comment: (NOTE) The Xpert Xpress SARS-CoV-2/FLU/RSV assay is intended as an aid in  the diagnosis of influenza from Nasopharyngeal swab specimens and  should not be used as a sole basis for treatment. Nasal washings and  aspirates are unacceptable for Xpert Xpress SARS-CoV-2/FLU/RSV  testing. Fact Sheet for Patients: PinkCheek.be Fact Sheet for Healthcare Providers: GravelBags.it This test is not yet approved or cleared by the [place] FDA and  has been authorized for detection and/or diagnosis of SARS-CoV-2 by  FDA under an Emergency Use Authorization (EUA). This EUA will remain  in effect (meaning this test can be used) for the duration of the  Covid-19 declaration under Section 564(b)(1) of the Act, 21  U.S.C. section 360bbb-3(b)(1), unless the authorization is  terminated or revoked. Performed at [facility], [place] [address], [place], [place] [address]   Blood Culture (routine x 2)     Status: None   Collection Time: 11/29/19  7:30 AM   Specimen: BLOOD  Result Value Ref Range Status   Specimen Description BLOOD RIGHT ANTECUBITAL  Final   Special Requests   Final    BOTTLES DRAWN AEROBIC AND ANAEROBIC Blood Culture adequate volume   Culture   Final    NO GROWTH 5 DAYS Performed at [facility], [place] [address] [place], [place] [address]    Report Status 12/04/2019 FINAL  Final  Blood Culture (routine x 2)     Status: None   Collection Time: 11/29/19  7:55 AM   Specimen: BLOOD RIGHT ARM  Result Value Ref Range Status   Specimen Description BLOOD RIGHT ARM  Final   Special Requests   Final    BOTTLES DRAWN AEROBIC AND ANAEROBIC Blood Culture adequate volume   Culture   Final    NO GROWTH 5 DAYS Performed at [facility], [place] [address], [place], [place] [address]    Report Status 12/04/2019 FINAL  Final      Radiology Studies: No  results found.  Scheduled Meds: . vitamin C  500 mg Oral Daily  .  aspirin  81 mg Oral Daily  . chlorpheniramine-HYDROcodone  5 mL Oral Q12H  . dexamethasone (DECADRON) injection  6 mg Intravenous Q24H  . enoxaparin (LOVENOX) injection  40 mg Subcutaneous Q24H  . Ipratropium-Albuterol  1 puff Inhalation Q6H  . zinc sulfate  220 mg Oral Daily   Continuous Infusions:   LOS: 8 days   Time spent= 35 mins  Evellyn Tuff Arsenio Loader, MD [facility]  If 7PM-7AM, please contact night-coverage  12/07/2019, 8:06 AM

## 2019-12-08 ENCOUNTER — Telehealth: Payer: Self-pay | Admitting: Family Medicine

## 2019-12-08 LAB — BASIC METABOLIC PANEL
Anion gap: 11 (ref 5–15)
BUN: 26 mg/dL — ABNORMAL HIGH (ref 8–23)
CO2: 24 mmol/L (ref 22–32)
Calcium: 8.5 mg/dL — ABNORMAL LOW (ref 8.9–10.3)
Chloride: 101 mmol/L (ref 98–111)
Creatinine, Ser: 0.79 mg/dL (ref 0.61–1.24)
GFR calc Af Amer: >60 mL/min (ref >60)
GFR calc non Af Amer: >60 mL/min (ref >60)
Glucose, Bld: 99 mg/dL (ref 70–99)
Potassium: 4.3 mmol/L (ref 3.5–5.1)
Sodium: 136 mmol/L (ref 135–145)

## 2019-12-08 LAB — CBC
HCT: 43.1 % (ref 39.0–52.0)
Hemoglobin: 14.2 g/dL (ref 13.0–17.0)
MCH: 29.7 pg (ref 26.0–34.0)
MCHC: 32.9 g/dL (ref 30.0–36.0)
MCV: 90.2 fL (ref 80.0–100.0)
Platelets: 305 10*3/uL (ref 150–400)
RBC: 4.78 MIL/uL (ref 4.22–5.81)
RDW: 13.2 % (ref 11.5–15.5)
WBC: 19 10*3/uL — ABNORMAL HIGH (ref 4.0–10.5)
nRBC: 0.2 % (ref 0.0–0.2)

## 2019-12-08 LAB — MAGNESIUM: Magnesium: 2.2 mg/dL (ref 1.7–2.4)

## 2019-12-08 MED ORDER — ALBUTEROL SULFATE HFA 108 (90 BASE) MCG/ACT IN AERS: 2.0000 | INHALATION_SPRAY | Freq: Four times a day (QID) | RESPIRATORY_TRACT | 0 refills | Status: DC | PRN

## 2019-12-08 NOTE — TOC Initial Note (Signed)
Transition of Care [facility]) - Initial/Assessment Note    Patient Details  Name: Dakota Cooke MRN: [id number] Date of Birth: 05-31-1956  Transition of [facility]) CM/SW Contact:    Alberteen Sam, LCSW Phone Number: 12/08/2019, 10:21 AM  Clinical Narrative:                  CSW has reached out to [place] with [employer] to set up home oxygen, they will deliver today and she will let me know ETA once she finds out   CSW spoke with patient's wife Dakota Cooke to inform of home oxygen set up and confirm home address is accurate in chart. Dakota Cooke will wait for home O2 to be delivered prior to coming to the hospital to pick patient up at [place] has informed [facility] that patient will need portable oxygen tank at dc for transportation home from [employer].   Pending delivery of home O2 for dc.     Expected Discharge Plan: Home/Self Care Barriers to Discharge: No Barriers Identified   Patient Goals and CMS Choice   CMS Medicare.gov Compare Post Acute Care list provided to:: Patient Represenative (must comment)(spouse Dakota Cooke) Choice offered to / list presented to : Spouse  Expected Discharge Plan and Services Expected Discharge Plan: Home/Self Care       Living arrangements for the past 2 months: Single Family Home Expected Discharge Date: 12/08/19               DME Arranged: Oxygen DME Agency: [place] Date DME Agency Contacted: 12/08/19 Time DME Agency Contacted: 93 Representative spoke with at DME Agency: Magda Paganini            Prior Living Arrangements/Services Living arrangements for the past 2 months: [place] Lives with:: Spouse Patient language and need for interpreter reviewed:: Yes Do you feel safe going back to the place where you live?: Yes      Need for Family Participation in Patient Care: Yes (Comment) Care giver support system in place?: Yes (comment)   Criminal Activity/Legal Involvement Pertinent to Current Situation/Hospitalization: No - Comment as  needed  Activities of Daily Living Home Assistive Devices/Equipment: None ADL Screening (condition at time of admission) Patient's cognitive ability adequate to safely complete daily activities?: Yes Is the patient deaf or have difficulty hearing?: No Does the patient have difficulty seeing, even when wearing glasses/contacts?: No Does the patient have difficulty concentrating, remembering, or making decisions?: No Patient able to express need for assistance with ADLs?: Yes Does the patient have difficulty dressing or bathing?: No Independently performs ADLs?: Yes (appropriate for developmental age) Does the patient have difficulty walking or climbing stairs?: No Weakness of Legs: None Weakness of Arms/Hands: None  Permission Sought/Granted Permission sought to share information with : Case Manager, Customer service manager, Family Supports Permission granted to share information with : Yes, Verbal Permission Granted  Share Information with NAME: Dakota Cooke  Permission granted to share info w AGENCY: DME - [employer] for oxygen  Permission granted to share info w Relationship: spouse  Permission granted to share info w Contact Information: [phone]  Emotional Assessment Appearance:: Other (Comment Required(unable to assess - remote)     Orientation: : Oriented to Self, Oriented to Place, Oriented to  Time, Oriented to Situation Alcohol / Substance Use: Not Applicable Psych Involvement: No (comment)  Admission diagnosis:  COVID-19 virus infection [U07.1] Acute respiratory disease due to COVID-19 virus [U07.1, J06.9] Patient Active Problem List   Diagnosis Date Noted  .  Acute respiratory disease due to COVID-19 virus 11/29/2019  . BPH (benign prostatic hyperplasia) 11/29/2019  . Hyperlipidemia 10/15/2019  . Hypertension    PCP:  Loman Brooklyn, FNP Pharmacy:   [facility] [address], [place] [address] [place] [address] Phone:  [phone] Fax: [phone]     Social Determinants of Health (SDOH) Interventions    Readmission Risk Interventions No flowsheet data found.

## 2019-12-08 NOTE — Care Management Important Message (Signed)
Important Message  Patient Details  Name: Dakota Cooke MRN: [id number] Date of Birth: October 29, 1955   Medicare Important Message Given:  Yes - Important Message mailed due to current National Emergency  Verbal consent obtained due to current National Emergency  Relationship to patient: Spouse/Significant Other Contact Name: Cason Seagren Call Date: 12/08/19  Time: 1159 Phone: [phone] Outcome: Spoke with contact Important Message mailed to: Patient address on file    Delorse Lek 12/08/2019, 11:59 AM

## 2019-12-08 NOTE — Plan of Care (Signed)
Pt A&Ox4. VSS, SpO2 >88% on 1L [place] at rest. Completed 6 min ambulatory test, required home O2. See notes for details. Utilizing [facility] & urinal independently w/o isuue  Reviewed d/c paperwork w/ pt, stated understanding & all questions answered. PIV removed. Pt left unit w/ all belongings  Tomie China

## 2019-12-08 NOTE — Discharge Summary (Signed)
Physician Discharge Summary  Dakota Cooke [id number] DOB: August 17, 1956 DOA: 11/29/2019  PCP: Loman Brooklyn, FNP  Admit date: 11/29/2019 Discharge date: 12/08/2019  Admitted From: Home Disposition: Home  Recommendations for Outpatient Follow-up:  1. Follow up with PCP in 1-2 weeks 2. Please obtain BMP/CBC in one week your next doctors visit.  3. Arrange for home oxygen 4. As needed bronchodilator/albuterol  Home Health: None Equipment/Devices: Home oxygen 3 L Discharge Condition: Stable CODE STATUS: Full code 2 g salt Diet recommendation:   Brief/Interim Summary: 64 year old with history of HTN, prediabetes, HLD, BPH recent loss of mother and travel to [place] where he contracted COVID-19 pneumonia.  Admitted to the hospital on 2/13 for worsening hypoxic respiratory failure.  Started on steroids, remdesivir, received a dose of Actemra.  Oxygen levels were slowly being weaned.  He was also intermittently diuresed which improved his symptoms significantly.  Over the course of several days he was weaned off oxygen down to 2 L nasal cannula with ambulation he needed about 3 L.  Symptomatically felt much better.  LFTs trended down. Wife has been updated today as well, he stable for discharge today.  Acute hypoxic respiratory failure secondary to COVID-19 pneumonia; persistent. Slow to improve.  -Minimal hypoxia desaturating down to 83% with ambulation.  We will discharge him home on 3 L nasal cannula. -Completed remdesivir and Decadron in the hospital.  Also received Actemra on 2/13 -Completed 7-day course of azithromycin. -Intermittently required diuretics. -Stable -Leukocytosis secondary to Decadron.  Should improve now that it is stopped.  Transaminitis; Improved.  -Resolved.  Essential hypertension -Resume home medications  Depression -Unsure if this is chronic diagnosis.  Likely grieving from recent family member loss  BPH -None  Discharge Diagnoses:   Principal Problem:   Acute respiratory disease due to COVID-19 virus Active Problems:   Hypertension   Hyperlipidemia   BPH (benign prostatic hyperplasia)     Subjective: Feels great, wishes to go home.  With ambulation he desaturates down to 84%, requiring home oxygen which she is okay with.  Overall feels great.  Discharge Exam: Vitals:   12/08/19 0414 12/08/19 0732  BP: 120/65 125/82  Pulse: (!) 51   Resp: 17 20  Temp: 98.4 F (36.9 C) 99.2 F (37.3 C)  SpO2: 90% (!) 89%   Vitals:   12/07/19 1959 12/07/19 2320 12/08/19 0414 12/08/19 0732  BP: (!) 154/81 126/68 120/65 125/82  Pulse: 74 (!) 54 (!) 51   Resp: 20 19 17 20   Temp: 97.6 F (36.4 C) 98.5 F (36.9 C) 98.4 F (36.9 C) 99.2 F (37.3 C)  TempSrc: Oral Oral Oral Oral  SpO2: (!) 89% 91% 90% (!) 89%  Weight:      Height:        General: Pt is alert, awake, not in acute distress Cardiovascular: RRR, S1/S2 +, no rubs, no gallops Respiratory: CTA bilaterally, no wheezing, no rhonchi Abdominal: Soft, NT, ND, bowel sounds + Extremities: no edema, no cyanosis  Discharge Instructions  Discharge Instructions    MyChart COVID-19 home monitoring program   Complete by: Dec 08, 2019    Is the patient willing to use the MyChart Mobile App for home monitoring?: Yes   Temperature monitoring   Complete by: Dec 08, 2019    After how many days would you like to receive a notification of this patient's flowsheet entries?: 1     Allergies as of 12/08/2019   No Known Allergies     Medication List  TAKE these medications   albuterol 108 (90 Base) MCG/ACT inhaler Commonly known as: VENTOLIN HFA Inhale 2 puffs into the lungs every 6 (six) hours as needed for wheezing or shortness of breath.   aspirin 81 MG EC tablet Take 81 mg by mouth daily. Swallow whole.   atorvastatin 10 MG tablet Commonly known as: LIPITOR Take 1 tablet (10 mg total) by mouth daily at 6 PM.   ibuprofen 200 MG tablet Commonly known as:  ADVIL Take 200 mg by mouth every 8 (eight) hours as needed for mild pain.   losartan 50 MG tablet Commonly known as: COZAAR Take 1 tablet (50 mg total) by mouth daily.            Durable Medical Equipment  (From admission, onward)         Start     Ordered   12/08/19 0949  For home use only DME oxygen  Once    Question Answer Comment  Length of Need 6 Months   Mode or (Route) Nasal cannula   Liters per Minute 3   Frequency Continuous (stationary and portable oxygen unit needed)   Oxygen delivery system Gas      12/08/19 0948         Follow-up Information    Loman Brooklyn, FNP. Schedule an appointment as soon as possible for a visit in 2 week(s).   Specialty: Family Medicine Contact information: [place] [address] [phone]          No Known Allergies  You were cared for by a hospitalist during your hospital stay. If you have any questions about your discharge medications or the care you received while you were in the hospital after you are discharged, you can call the unit and asked to speak with the hospitalist on call if the hospitalist that took care of you is not available. Once you are discharged, your primary care physician will handle any further medical issues. Please note that no refills for any discharge medications will be authorized once you are discharged, as it is imperative that you return to your primary care physician (or establish a relationship with a primary care physician if you do not have one) for your aftercare needs so that they can reassess your need for medications and monitor your lab values.   Procedures/Studies: DG CHEST PORT 1 VIEW  Result Date: 12/03/2019 CLINICAL DATA:  Pneumonia EXAM: PORTABLE CHEST 1 VIEW COMPARISON:  11/30/2019 FINDINGS: Patchy bilateral airspace disease again noted, slightly worsened since prior study. Heart is borderline in size. No visible effusions or pneumothorax. No acute bony  abnormality. IMPRESSION: Worsening patchy bilateral airspace disease/pneumonia. Electronically Signed   By: Rolm Baptise M.D.   On: 12/03/2019 09:31   DG CHEST PORT 1 VIEW  Result Date: 11/30/2019 CLINICAL DATA:  COVID-19 positivity EXAM: PORTABLE CHEST 1 VIEW COMPARISON:  11/28/2017 FINDINGS: Cardiac shadow is stable. Patchy opacities are again identified in both lungs and stable in appearance. Postsurgical changes in the thoracolumbar spine are noted. IMPRESSION: No change from the previous day. Electronically Signed   By: Inez Catalina M.D.   On: 11/30/2019 09:21   DG Chest Port 1 View  Result Date: 11/29/2019 CLINICAL DATA:  Fatigue, shortness of breath and body aches. Positive test for COVID-19 one week ago. EXAM: PORTABLE CHEST 1 VIEW COMPARISON:  None. FINDINGS: The heart is mildly enlarged. Bilateral pulmonary infiltrates present predominantly in the lower lung zones. No overt pulmonary edema, pleural fluid  or pneumothorax. IMPRESSION: Bilateral pulmonary infiltrates predominantly in the lower lung zones. Electronically Signed   By: Aletta Edouard M.D.   On: 11/29/2019 08:49     The results of significant diagnostics from this hospitalization (including imaging, microbiology, ancillary and laboratory) are listed below for reference.     Microbiology: Recent Results (from the past 240 hour(s))  Respiratory Panel by RT PCR (Flu A&B, Covid) - Nasopharyngeal Swab     Status: Abnormal   Collection Time: 11/29/19  7:05 AM   Specimen: Nasopharyngeal Swab  Result Value Ref Range Status   SARS Coronavirus 2 by RT PCR POSITIVE (A) NEGATIVE Final    Comment: RESULT CALLED TO, READ BACK BY AND VERIFIED WITH: CARLIN ON YT:[id number] AT 1004 BY NFIELDS (NOTE) SARS-CoV-2 target nucleic acids are DETECTED. SARS-CoV-2 RNA is generally detectable in upper respiratory specimens  during the acute phase of infection. Positive results are indicative of the presence of the identified virus, but do not rule  out bacterial infection or co-infection with other pathogens not detected by the test. Clinical correlation with patient history and other diagnostic information is necessary to determine patient infection status. The expected result is Negative. Fact Sheet for Patients:  PinkCheek.be Fact Sheet for Healthcare Providers: GravelBags.it This test is not yet approved or cleared by the [place] FDA and  has been authorized for detection and/or diagnosis of SARS-CoV-2 by FDA under an Emergency Use Authorization (EUA).  This EUA will remain in effect (meaning this test can be used) f or the duration of  the COVID-19 declaration under Section 564(b)(1) of the Act, 21 U.S.C. section 360bbb-3(b)(1), unless the authorization is terminated or revoked sooner.    Influenza A by PCR NEGATIVE NEGATIVE Final   Influenza B by PCR NEGATIVE NEGATIVE Final    Comment: (NOTE) The Xpert Xpress SARS-CoV-2/FLU/RSV assay is intended as an aid in  the diagnosis of influenza from Nasopharyngeal swab specimens and  should not be used as a sole basis for treatment. Nasal washings and  aspirates are unacceptable for Xpert Xpress SARS-CoV-2/FLU/RSV  testing. Fact Sheet for Patients: PinkCheek.be Fact Sheet for Healthcare Providers: GravelBags.it This test is not yet approved or cleared by the [place] FDA and  has been authorized for detection and/or diagnosis of SARS-CoV-2 by  FDA under an Emergency Use Authorization (EUA). This EUA will remain  in effect (meaning this test can be used) for the duration of the  Covid-19 declaration under Section 564(b)(1) of the Act, 21  U.S.C. section 360bbb-3(b)(1), unless the authorization is  terminated or revoked. Performed at [facility], [place] [address] [place], [place] [address]   Blood Culture (routine x 2)     Status: None    Collection Time: 11/29/19  7:30 AM   Specimen: BLOOD  Result Value Ref Range Status   Specimen Description BLOOD RIGHT ANTECUBITAL  Final   Special Requests   Final    BOTTLES DRAWN AEROBIC AND ANAEROBIC Blood Culture adequate volume   Culture   Final    NO GROWTH 5 DAYS Performed at [facility], [place] [address], [place], [place] [address]    Report Status 12/04/2019 FINAL  Final  Blood Culture (routine x 2)     Status: None   Collection Time: 11/29/19  7:55 AM   Specimen: BLOOD RIGHT ARM  Result Value Ref Range Status   Specimen Description BLOOD RIGHT ARM  Final   Special Requests   Final    BOTTLES  DRAWN AEROBIC AND ANAEROBIC Blood Culture adequate volume   Culture   Final    NO GROWTH 5 DAYS Performed at [facility], [place] [address] [place], [place] [address]    Report Status 12/04/2019 FINAL  Final     Labs: BNP (last 3 results) Recent Labs    12/03/19 0145  BNP 123456   Basic Metabolic Panel: Recent Labs  Lab 12/02/19 0347 12/03/19 0145 12/04/19 0145 12/05/19 0016 12/06/19 0626 12/07/19 0135 12/08/19 0105  NA 143   < > 140 139 140 137 136  K 4.2   < > 4.2 4.1 5.0 4.7 4.3  CL 106   < > 102 103 104 101 101  CO2 27   < > 25 26 25 25 24   GLUCOSE 96   < > 87 124* 104* 116* 99  BUN 24*   < > 28* 28* 25* 22 26*  CREATININE 0.85   < > 0.85 0.75 0.65 0.78 0.79  CALCIUM 8.3*   < > 8.5* 8.4* 8.8* 8.6* 8.5*  MG 2.1  --  2.1 2.2 2.2 2.2 2.2  PHOS 3.9  --   --   --   --   --   --    < > = values in this interval not displayed.   Liver Function Tests: Recent Labs  Lab 12/02/19 0347 12/03/19 0145 12/05/19 0140  AST 54* 74* 39  ALT 83* 127* 88*  ALKPHOS 42 42 47  BILITOT 1.0 0.6 0.5  PROT 5.9* 6.0* 5.7*  ALBUMIN 3.0* 3.2* 3.1*   No results for input(s): LIPASE, AMYLASE in the last 168 hours. No results for input(s): AMMONIA in the last 168 hours. CBC: Recent Labs  Lab 12/02/19 0347 12/02/19 0347 12/03/19 0145 12/03/19 0145 12/04/19 0145  12/05/19 0016 12/06/19 0626 12/07/19 0135 12/08/19 0105  WBC 8.7   < > 9.0   < > 12.0* 11.9* 17.6* 18.6* 19.0*  NEUTROABS 7.2  --  7.2  --   --   --   --   --   --   HGB 13.9   < > 13.7   < > 14.4 14.3 14.7 14.5 14.2  HCT 41.1   < > 42.1   < > 43.2 41.9 43.4 44.1 43.1  MCV 89.2   < > 89.4   < > 88.9 88.4 88.2 90.4 90.2  PLT 272   < > 288   < > 324 320 339 343 305   < > = values in this interval not displayed.   Cardiac Enzymes: No results for input(s): CKTOTAL, CKMB, CKMBINDEX, TROPONINI in the last 168 hours. BNP: Invalid input(s): POCBNP CBG: No results for input(s): GLUCAP in the last 168 hours. D-Dimer Recent Labs    12/07/19 0135  DDIMER 0.55*   Hgb A1c No results for input(s): HGBA1C in the last 72 hours. Lipid Profile No results for input(s): CHOL, HDL, LDLCALC, TRIG, CHOLHDL, LDLDIRECT in the last 72 hours. Thyroid function studies No results for input(s): TSH, T4TOTAL, T3FREE, THYROIDAB in the last 72 hours.  Invalid input(s): FREET3 Anemia work up No results for input(s): VITAMINB12, FOLATE, FERRITIN, TIBC, IRON, RETICCTPCT in the last 72 hours. Urinalysis No results found for: COLORURINE, APPEARANCEUR, [place], [place], [place], [place], [place], [place], PROTEINUR, UROBILINOGEN, NITRITE, LEUKOCYTESUR Sepsis Labs Invalid input(s): PROCALCITONIN,  WBC,  LACTICIDVEN Microbiology Recent Results (from the past 240 hour(s))  Respiratory Panel by RT PCR (Flu A&B, Covid) - Nasopharyngeal Swab     Status:  Abnormal   Collection Time: 11/29/19  7:05 AM   Specimen: Nasopharyngeal Swab  Result Value Ref Range Status   SARS Coronavirus 2 by RT PCR POSITIVE (A) NEGATIVE Final    Comment: RESULT CALLED TO, READ BACK BY AND VERIFIED WITH: CARLIN ON YT:[id number] AT 1004 BY NFIELDS (NOTE) SARS-CoV-2 target nucleic acids are DETECTED. SARS-CoV-2 RNA is generally detectable in upper respiratory specimens  during the acute phase of infection. Positive results are  indicative of the presence of the identified virus, but do not rule out bacterial infection or co-infection with other pathogens not detected by the test. Clinical correlation with patient history and other diagnostic information is necessary to determine patient infection status. The expected result is Negative. Fact Sheet for Patients:  PinkCheek.be Fact Sheet for Healthcare Providers: GravelBags.it This test is not yet approved or cleared by the [place] FDA and  has been authorized for detection and/or diagnosis of SARS-CoV-2 by FDA under an Emergency Use Authorization (EUA).  This EUA will remain in effect (meaning this test can be used) f or the duration of  the COVID-19 declaration under Section 564(b)(1) of the Act, 21 U.S.C. section 360bbb-3(b)(1), unless the authorization is terminated or revoked sooner.    Influenza A by PCR NEGATIVE NEGATIVE Final   Influenza B by PCR NEGATIVE NEGATIVE Final    Comment: (NOTE) The Xpert Xpress SARS-CoV-2/FLU/RSV assay is intended as an aid in  the diagnosis of influenza from Nasopharyngeal swab specimens and  should not be used as a sole basis for treatment. Nasal washings and  aspirates are unacceptable for Xpert Xpress SARS-CoV-2/FLU/RSV  testing. Fact Sheet for Patients: PinkCheek.be Fact Sheet for Healthcare Providers: GravelBags.it This test is not yet approved or cleared by the [place] FDA and  has been authorized for detection and/or diagnosis of SARS-CoV-2 by  FDA under an Emergency Use Authorization (EUA). This EUA will remain  in effect (meaning this test can be used) for the duration of the  Covid-19 declaration under Section 564(b)(1) of the Act, 21  U.S.C. section 360bbb-3(b)(1), unless the authorization is  terminated or revoked. Performed at [facility], [place] [address] [place],  [place] [address]   Blood Culture (routine x 2)     Status: None   Collection Time: 11/29/19  7:30 AM   Specimen: BLOOD  Result Value Ref Range Status   Specimen Description BLOOD RIGHT ANTECUBITAL  Final   Special Requests   Final    BOTTLES DRAWN AEROBIC AND ANAEROBIC Blood Culture adequate volume   Culture   Final    NO GROWTH 5 DAYS Performed at [facility], [place] [address], [place], [place] [address]    Report Status 12/04/2019 FINAL  Final  Blood Culture (routine x 2)     Status: None   Collection Time: 11/29/19  7:55 AM   Specimen: BLOOD RIGHT ARM  Result Value Ref Range Status   Specimen Description BLOOD RIGHT ARM  Final   Special Requests   Final    BOTTLES DRAWN AEROBIC AND ANAEROBIC Blood Culture adequate volume   Culture   Final    NO GROWTH 5 DAYS Performed at [facility], [place] [address] [place], [place] [address]    Report Status 12/04/2019 FINAL  Final     Time coordinating discharge:  I have spent 35 minutes face to face with the patient and on the ward discussing the patients care, assessment, plan and disposition with other care givers. >  50% of the time was devoted counseling the patient about the risks and benefits of treatment/Discharge disposition and coordinating care.   SIGNED:   Damita Lack, MD  [facility] 12/08/2019, 9:52 AM   If 7PM-7AM, please contact night-coverage

## 2019-12-08 NOTE — Telephone Encounter (Signed)
Patient discharged from hospital today after having Covid.  Needs hospital follow up.  Tele visit scheduled 12/12/2019 with Hendricks Limes.

## 2019-12-08 NOTE — Progress Notes (Signed)
SATURATION QUALIFICATIONS: (This note is used to comply with regulatory documentation for home oxygen)  Patient Saturations on Room Air at Rest = 89%  Patient Saturations on Room Air while Ambulating = 84%  Patient Saturations on 3 Liters of oxygen while Ambulating = 91%  Please briefly explain why patient needs home oxygen:  Pt requires supplemental oxygen while ambulating/exerting himself.   Tomie China

## 2019-12-09 ENCOUNTER — Ambulatory Visit: Payer: Self-pay | Admitting: Family Medicine

## 2019-12-10 ENCOUNTER — Telehealth: Payer: Self-pay | Admitting: *Deleted

## 2019-12-10 NOTE — Telephone Encounter (Signed)
    TRANSITIONAL CARE MANAGEMENT TELEPHONE OUTREACH NOTE   Contact Date: 12/10/2019 Contacted By: Eston Mould, LPN   DISCHARGE INFORMATION Date of Discharge:12/08/19 Discharge Facility: [facility] Principal Discharge Diagnosis: Acute respiratory disease due to COVID-19 virus   Outpatient Follow Up Recommendations  1. Follow up with PCP in 1-2 weeks 2. Please obtain BMP/CBC in one week your next doctors visit.  3. Arrange for home oxygen 4. As needed bronchodilator/albuterol   Dakota Cooke is a male primary care patient of Dakota Brooklyn, FNP. An outgoing telephone call was made today and I spoke with Dakota Cooke.  Dakota Cooke condition(s) and treatment(s) were discussed. An opportunity to ask questions was provided and all were answered or forwarded as appropriate.    ACTIVITIES OF DAILY LIVING  Dakota Cooke lives with their spouse and he can perform ADLs independently. his primary caregiver is himself. he is able to depend on his primary caregiver(s) for consistent help. Transportation to appointments, to pick up medications, and to run errands is not a problem.     Fall Risk Fall Risk  10/14/2019  Falls in the past year? 1  Number falls in past yr: 0  Injury with Fall? 0    low [place] Modifications/Assistive Devices Wheelchair: No Cane: No Ramp: No Bedside Toilet: No Hospital Bed:  No Other:Home Oxygen 3L (pt states he is only using as needed)    [place] he is not receiving home health    MEDICATION RECONCILIATION  Dakota Cooke has been able to pick-up all prescribed discharge medications from the pharmacy.   A post discharge medication reconciliation was performed and the complete medication list was reviewed with the patient/caregiver and is current as of 12/10/2019. Changes highlighted below.  Discontinued Medications   Current Medication List Allergies as of 12/10/2019   No Known Allergies     Medication List       Accurate as of December 10, 2019  2:40 PM. If you have any questions, ask your nurse or doctor.        albuterol 108 (90 Base) MCG/ACT inhaler Commonly known as: VENTOLIN HFA Inhale 2 puffs into the lungs every 6 (six) hours as needed for wheezing or shortness of breath.   aspirin 81 MG EC tablet Take 81 mg by mouth daily. Swallow whole.   atorvastatin 10 MG tablet Commonly known as: LIPITOR Take 1 tablet (10 mg total) by mouth daily at 6 PM.    ibuprofen 200 MG tablet Commonly known as: ADVIL Take 200 mg by mouth every 8 (eight) hours as needed for mild pain.-Not Taking   losartan 50 MG tablet Commonly known as: COZAAR Take 1 tablet (50 mg total) by mouth daily.        PATIENT EDUCATION & FOLLOW-UP PLAN  An Virtual Video appointment for Transitional Care Management is scheduled with Dakota Brooklyn, FNP on 12/12/19 at 2:50.  Take all medications as prescribed  Contact our office by calling [phone] if you have any questions or concerns

## 2019-12-12 ENCOUNTER — Telehealth (INDEPENDENT_AMBULATORY_CARE_PROVIDER_SITE_OTHER): Payer: Medicare HMO | Admitting: Family Medicine

## 2019-12-12 ENCOUNTER — Ambulatory Visit: Payer: Medicare HMO

## 2019-12-12 ENCOUNTER — Other Ambulatory Visit: Payer: Self-pay

## 2019-12-12 ENCOUNTER — Encounter: Payer: Self-pay | Admitting: Family Medicine

## 2019-12-12 DIAGNOSIS — U071 COVID-19: Secondary | ICD-10-CM

## 2019-12-12 NOTE — Progress Notes (Signed)
Virtual Visit via Video Note  I connected with Dakota Cooke on 12/12/19 at 3:45 PM by video and verified that I am speaking with the correct person using two identifiers. Dakota Cooke is currently located at home and his wife is currently with him during visit. The provider, Loman Brooklyn, FNP is located in their office at time of visit.  I discussed the limitations, risks, security and privacy concerns of performing an evaluation and management service by video and the availability of in person appointments. I also discussed with the patient that there may be a patient responsible charge related to this service. The patient expressed understanding and agreed to proceed.  Subjective: PCP: Loman Brooklyn, FNP  Chief Complaint  Patient presents with  . Hospitalization Follow-up   Patient was recently hospitalized from 11/29/2019 through 12/08/2019 due to COVID-19 pneumonia.  He was admitted for worsening hypoxic respiratory failure.  He was treated with steroids, remdesivir, Actemra, azithromycin, and intermittent diuretics.  Oxygen levels were slowly being weaned.  He was discharged with home oxygen on 3 L via nasal cannula per the discharge summary.  Patient reports he has been checking his oxygen saturation at home and has not gotten less than 90%.  He is not wearing the oxygen continuously but as needed when he feels he needs it.  He states when he feels he needs it his oxygen saturation is 90 to 91% but his heart rate is elevated.  At the time of our conversation he was not wearing oxygen and his oxygen saturation was 96% with a heart rate of 77.  Patient does have an albuterol inhaler that they discharged him with but states he has not had to use it.  He states he feels he is getting better every day.  He is trying to be active walking around the house and not just sitting around.  Patient does still need a CBC and CMP.   ROS: Per HPI  Current Outpatient Medications:  .  albuterol  (VENTOLIN HFA) 108 (90 Base) MCG/ACT inhaler, Inhale 2 puffs into the lungs every 6 (six) hours as needed for wheezing or shortness of breath., Disp: 8 g, Rfl: 0 .  aspirin 81 MG EC tablet, Take 81 mg by mouth daily. Swallow whole., Disp: , Rfl:  .  atorvastatin (LIPITOR) 10 MG tablet, Take 1 tablet (10 mg total) by mouth daily at 6 PM., Disp: 30 tablet, Rfl: 2 .  losartan (COZAAR) 50 MG tablet, Take 1 tablet (50 mg total) by mouth daily., Disp: 30 tablet, Rfl: 2  No Known Allergies Past Medical History:  Diagnosis Date  . BPH (benign prostatic hyperplasia)   . Hyperlipidemia 10/15/2019  . Hypertension   . Personal history of covid-19 11/2019    Observations/Objective: Physical Exam Vitals reviewed.  Constitutional:      General: He is not in acute distress.    Appearance: Normal appearance. He is not ill-appearing, toxic-appearing or diaphoretic.  HENT:     Head: Normocephalic and atraumatic.  Eyes:     General: No scleral icterus.       Right eye: No discharge.        Left eye: No discharge.     Conjunctiva/sclera: Conjunctivae normal.  Cardiovascular:     Rate and Rhythm: Normal rate.  Pulmonary:     Effort: Pulmonary effort is normal. No respiratory distress.  Musculoskeletal:     Cervical back: Normal range of motion.  Neurological:     Mental Status: He  is alert and oriented to person, place, and time. Mental status is at baseline.  Psychiatric:        Mood and Affect: Mood normal.        Behavior: Behavior normal.        Thought Content: Thought content normal.        Judgment: Judgment normal.    Assessment and Plan: 1. COVID-19 - Improving.  Patient to continue his quarantine.  Continue albuterol and oxygen as needed.  He has an appointment over at the respiratory clinic on [address] at 6 PM today for labs. - CBC with Differential/Platelet - CMP14+EGFR   Follow Up Instructions:   I discussed the assessment and treatment plan with the patient. The patient was  provided an opportunity to ask questions and all were answered. The patient agreed with the plan and demonstrated an understanding of the instructions.   The patient was advised to call back or seek an in-person evaluation if the symptoms worsen or if the condition fails to improve as anticipated.  The above assessment and management plan was discussed with the patient. The patient verbalized understanding of and has agreed to the management plan. Patient is aware to call the clinic if symptoms persist or worsen. Patient is aware when to return to the clinic for a follow-up visit. Patient educated on when it is appropriate to go to the emergency department.   Time call ended: 3:55 PM  I provided 13 minutes of face-to-face time during this encounter.   Hendricks Limes, MSN, APRN, FNP-C [facility] 12/12/19

## 2019-12-14 NOTE — Progress Notes (Signed)
Labs drawn as ordered by PCP.

## 2019-12-29 ENCOUNTER — Other Ambulatory Visit (INDEPENDENT_AMBULATORY_CARE_PROVIDER_SITE_OTHER): Payer: Medicare HMO

## 2019-12-29 ENCOUNTER — Other Ambulatory Visit: Payer: Self-pay

## 2019-12-29 DIAGNOSIS — E782 Mixed hyperlipidemia: Secondary | ICD-10-CM

## 2019-12-29 DIAGNOSIS — I10 Essential (primary) hypertension: Secondary | ICD-10-CM

## 2019-12-30 ENCOUNTER — Other Ambulatory Visit: Payer: Self-pay | Admitting: Family Medicine

## 2019-12-30 ENCOUNTER — Other Ambulatory Visit: Payer: Self-pay

## 2019-12-30 DIAGNOSIS — R748 Abnormal levels of other serum enzymes: Secondary | ICD-10-CM

## 2019-12-30 LAB — CBC WITH DIFFERENTIAL/PLATELET
Basophils Absolute: 0 10*3/uL (ref 0.0–0.2)
Basos: 1 %
EOS (ABSOLUTE): 0.2 10*3/uL (ref 0.0–0.4)
Eos: 4 %
Hematocrit: 47.5 % (ref 37.5–51.0)
Hemoglobin: 15.8 g/dL (ref 13.0–17.7)
Immature Grans (Abs): 0.1 10*3/uL (ref 0.0–0.1)
Immature Granulocytes: 1 %
Lymphocytes Absolute: 2 10*3/uL (ref 0.7–3.1)
Lymphs: 37 %
MCH: 30.6 pg (ref 26.6–33.0)
MCHC: 33.3 g/dL (ref 31.5–35.7)
MCV: 92 fL (ref 79–97)
Monocytes Absolute: 0.6 10*3/uL (ref 0.1–0.9)
Monocytes: 11 %
Neutrophils Absolute: 2.5 10*3/uL (ref 1.4–7.0)
Neutrophils: 46 %
Platelets: 155 10*3/uL (ref 150–450)
RBC: 5.16 x10E6/uL (ref 4.14–5.80)
RDW: 15.7 % — ABNORMAL HIGH (ref 11.6–15.4)
WBC: 5.5 10*3/uL (ref 3.4–10.8)

## 2019-12-30 LAB — CMP14+EGFR
ALT: 125 IU/L — ABNORMAL HIGH (ref 0–44)
AST: 69 IU/L — ABNORMAL HIGH (ref 0–40)
Albumin/Globulin Ratio: 2.4 — ABNORMAL HIGH (ref 1.2–2.2)
Albumin: 4.7 g/dL (ref 3.8–4.8)
Alkaline Phosphatase: 63 IU/L (ref 39–117)
BUN/Creatinine Ratio: 14 (ref 10–24)
BUN: 12 mg/dL (ref 8–27)
Bilirubin Total: 0.8 mg/dL (ref 0.0–1.2)
CO2: 23 mmol/L (ref 20–29)
Calcium: 9 mg/dL (ref 8.6–10.2)
Chloride: 106 mmol/L (ref 96–106)
Creatinine, Ser: 0.87 mg/dL (ref 0.76–1.27)
GFR calc Af Amer: 105 mL/min/{1.73_m2} (ref >59)
GFR calc non Af Amer: 91 mL/min/{1.73_m2} (ref >59)
Globulin, Total: 2 g/dL (ref 1.5–4.5)
Glucose: 92 mg/dL (ref 65–99)
Potassium: 4.6 mmol/L (ref 3.5–5.2)
Sodium: 145 mmol/L — ABNORMAL HIGH (ref 134–144)
Total Protein: 6.7 g/dL (ref 6.0–8.5)

## 2019-12-31 LAB — SPECIMEN STATUS REPORT

## 2019-12-31 LAB — TSH: TSH: 2.31 u[IU]/mL (ref 0.450–4.500)

## 2020-01-05 DIAGNOSIS — U071 COVID-19: Secondary | ICD-10-CM | POA: Diagnosis not present

## 2020-01-05 DIAGNOSIS — J9601 Acute respiratory failure with hypoxia: Secondary | ICD-10-CM | POA: Diagnosis not present

## 2020-01-05 DIAGNOSIS — J1282 Pneumonia due to coronavirus disease 2019: Secondary | ICD-10-CM | POA: Diagnosis not present

## 2020-01-07 DIAGNOSIS — Z7982 Long term (current) use of aspirin: Secondary | ICD-10-CM | POA: Diagnosis not present

## 2020-01-07 DIAGNOSIS — E669 Obesity, unspecified: Secondary | ICD-10-CM | POA: Diagnosis not present

## 2020-01-07 DIAGNOSIS — I10 Essential (primary) hypertension: Secondary | ICD-10-CM | POA: Diagnosis not present

## 2020-01-07 DIAGNOSIS — Z87891 Personal history of nicotine dependence: Secondary | ICD-10-CM | POA: Diagnosis not present

## 2020-01-07 DIAGNOSIS — Z8249 Family history of ischemic heart disease and other diseases of the circulatory system: Secondary | ICD-10-CM | POA: Diagnosis not present

## 2020-01-07 DIAGNOSIS — Z9981 Dependence on supplemental oxygen: Secondary | ICD-10-CM | POA: Diagnosis not present

## 2020-01-07 DIAGNOSIS — Z820 Family history of epilepsy and other diseases of the nervous system: Secondary | ICD-10-CM | POA: Diagnosis not present

## 2020-01-07 DIAGNOSIS — Z809 Family history of malignant neoplasm, unspecified: Secondary | ICD-10-CM | POA: Diagnosis not present

## 2020-01-09 ENCOUNTER — Other Ambulatory Visit: Payer: Self-pay

## 2020-01-09 ENCOUNTER — Other Ambulatory Visit: Payer: Medicare HMO

## 2020-01-09 DIAGNOSIS — R945 Abnormal results of liver function studies: Secondary | ICD-10-CM | POA: Diagnosis not present

## 2020-01-09 DIAGNOSIS — R748 Abnormal levels of other serum enzymes: Secondary | ICD-10-CM | POA: Diagnosis not present

## 2020-01-10 LAB — HEPATIC FUNCTION PANEL
ALT: 76 IU/L — ABNORMAL HIGH (ref 0–44)
AST: 46 IU/L — ABNORMAL HIGH (ref 0–40)
Albumin: 5 g/dL — ABNORMAL HIGH (ref 3.8–4.8)
Alkaline Phosphatase: 60 IU/L (ref 39–117)
Bilirubin Total: 0.8 mg/dL (ref 0.0–1.2)
Bilirubin, Direct: 0.17 mg/dL (ref 0.00–0.40)
Total Protein: 6.8 g/dL (ref 6.0–8.5)

## 2020-01-10 LAB — HEPATITIS PANEL, ACUTE
Hep A IgM: NEGATIVE
Hep B C IgM: NEGATIVE
Hep C Virus Ab: <0.1 s/co ratio (ref 0.0–0.9)
Hepatitis B Surface Ag: NEGATIVE

## 2020-01-26 ENCOUNTER — Other Ambulatory Visit: Payer: Self-pay | Admitting: Family Medicine

## 2020-01-26 DIAGNOSIS — I10 Essential (primary) hypertension: Secondary | ICD-10-CM

## 2020-02-05 DIAGNOSIS — J1282 Pneumonia due to coronavirus disease 2019: Secondary | ICD-10-CM | POA: Diagnosis not present

## 2020-02-05 DIAGNOSIS — J9601 Acute respiratory failure with hypoxia: Secondary | ICD-10-CM | POA: Diagnosis not present

## 2020-02-05 DIAGNOSIS — U071 COVID-19: Secondary | ICD-10-CM | POA: Diagnosis not present

## 2020-02-26 ENCOUNTER — Telehealth: Payer: Self-pay | Admitting: Family Medicine

## 2020-02-26 DIAGNOSIS — I10 Essential (primary) hypertension: Secondary | ICD-10-CM

## 2020-02-26 MED ORDER — LOSARTAN POTASSIUM 50 MG PO TABS: 50.0000 mg | ORAL_TABLET | Freq: Every day | ORAL | 0 refills | Status: DC

## 2020-02-26 NOTE — Telephone Encounter (Signed)
  Prescription Request  02/26/2020  What is the name of the medication or equipment? Losartan  Have you contacted your pharmacy to request a refill? (if applicable) Yes  Which pharmacy would you like this sent to? [facility], [place]  Pt ran out of his Rx yesterday and is requesting refills.   Patient notified that their request is being sent to the clinical staff for review and that they should receive a response within 2 business days.

## 2020-02-26 NOTE — Telephone Encounter (Signed)
Rx filled for #30. Patient needs to be seen.

## 2020-03-03 IMAGING — DX DG CHEST 1V PORT
1 series · 1 of 1 positions shown · non-contrast
Comparison: None.

CLINICAL DATA: Fatigue, shortness of breath and body aches.
Positive test for VLERW-HS one week ago.

EXAM:
PORTABLE CHEST 1 VIEW

[chest ap]
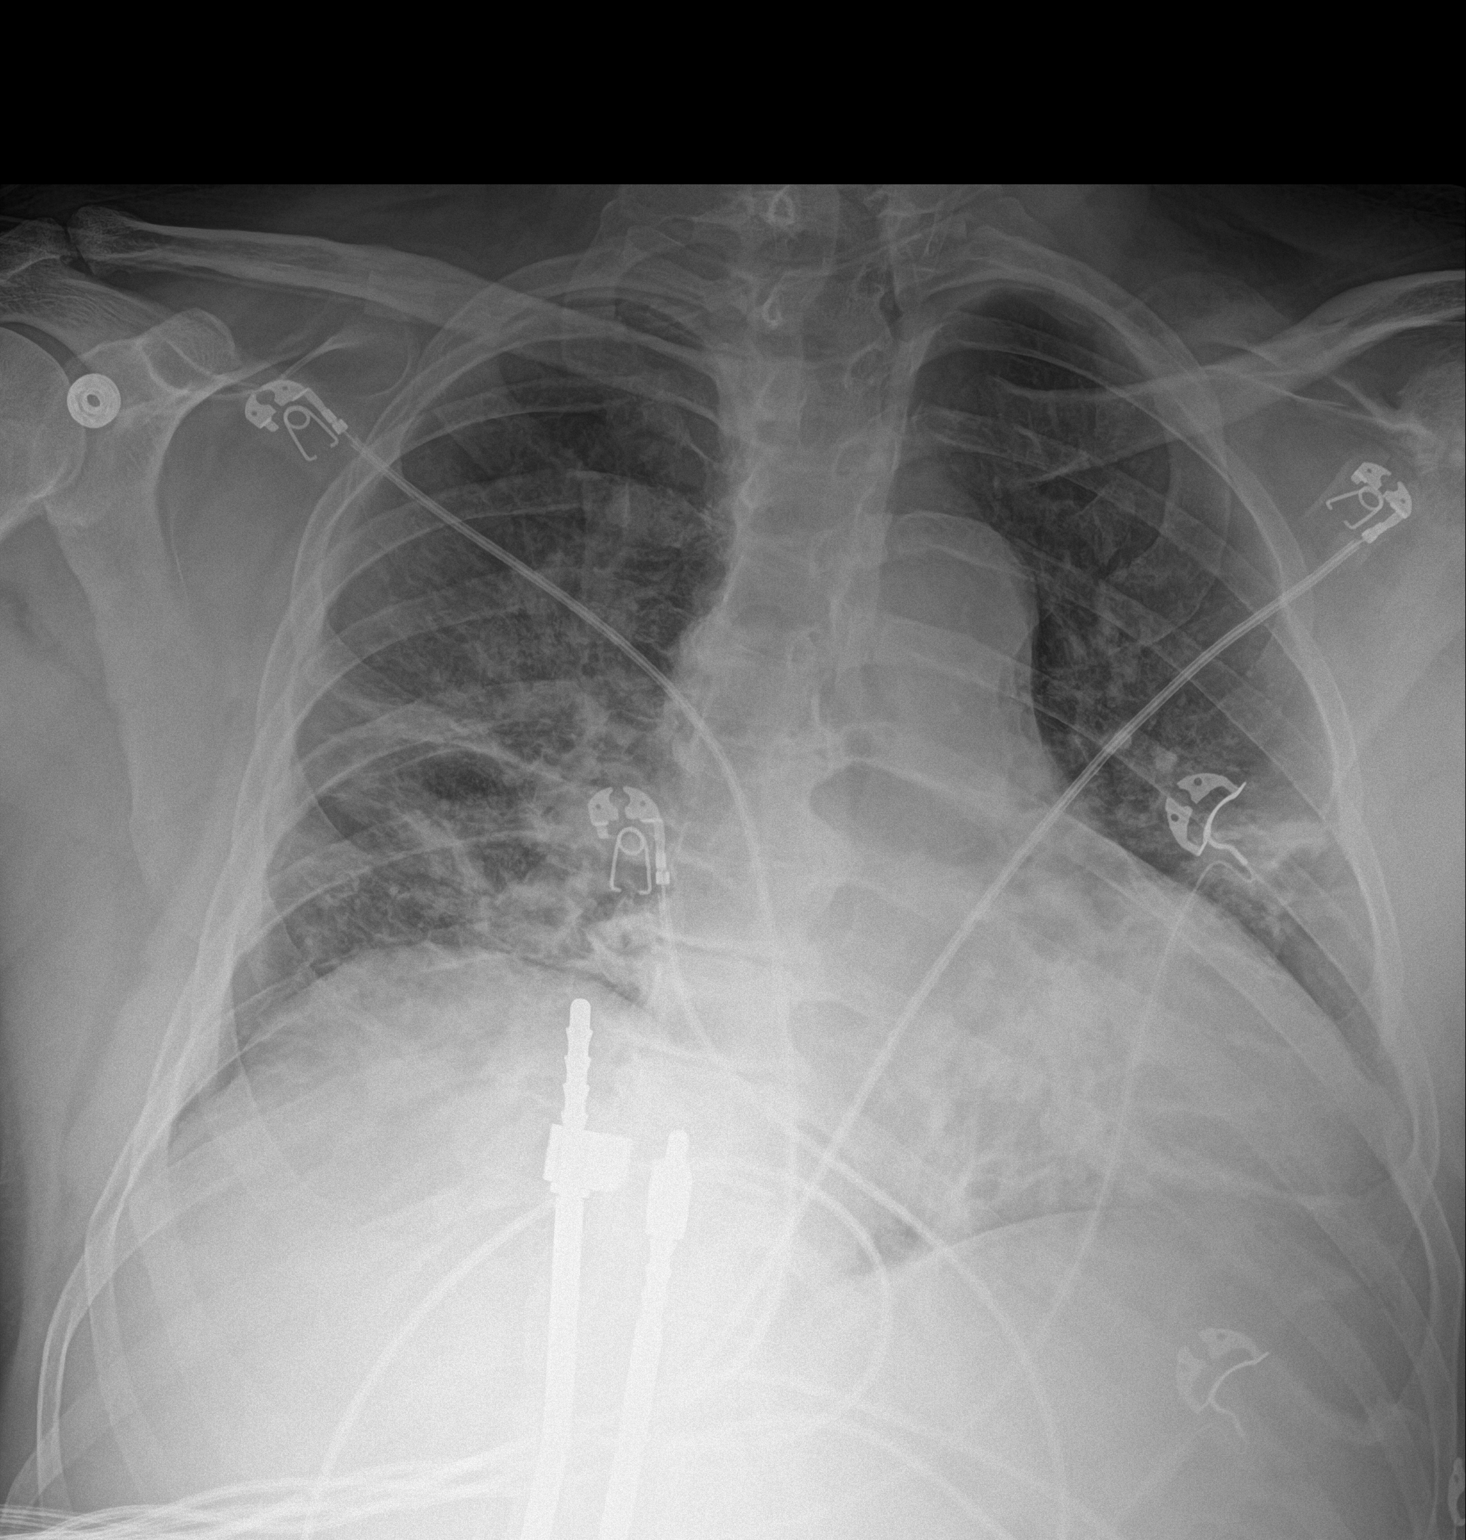

[1 of 1 positions shown; findings below may reference images not displayed]

FINDINGS: The heart is mildly enlarged. Bilateral pulmonary infiltrates
present predominantly in the lower lung zones. No overt pulmonary
edema, pleural fluid or pneumothorax.
IMPRESSION: Bilateral pulmonary infiltrates predominantly in the lower lung
zones.

## 2020-03-03 NOTE — Telephone Encounter (Signed)
Appointment is scheduled for 03/16/2020

## 2020-03-06 DIAGNOSIS — J9601 Acute respiratory failure with hypoxia: Secondary | ICD-10-CM | POA: Diagnosis not present

## 2020-03-06 DIAGNOSIS — J1282 Pneumonia due to coronavirus disease 2019: Secondary | ICD-10-CM | POA: Diagnosis not present

## 2020-03-06 DIAGNOSIS — U071 COVID-19: Secondary | ICD-10-CM | POA: Diagnosis not present

## 2020-03-16 ENCOUNTER — Other Ambulatory Visit: Payer: Self-pay

## 2020-03-16 ENCOUNTER — Encounter: Payer: Self-pay | Admitting: Family Medicine

## 2020-03-16 ENCOUNTER — Ambulatory Visit (INDEPENDENT_AMBULATORY_CARE_PROVIDER_SITE_OTHER): Payer: Medicare HMO | Admitting: Family Medicine

## 2020-03-16 VITALS — BP 145/86 | HR 62 | Temp 97.7°F | Ht 68.0 in | Wt 204.6 lb

## 2020-03-16 DIAGNOSIS — Z1211 Encounter for screening for malignant neoplasm of colon: Secondary | ICD-10-CM

## 2020-03-16 DIAGNOSIS — I10 Essential (primary) hypertension: Secondary | ICD-10-CM | POA: Diagnosis not present

## 2020-03-16 DIAGNOSIS — E782 Mixed hyperlipidemia: Secondary | ICD-10-CM | POA: Diagnosis not present

## 2020-03-16 MED ORDER — LOSARTAN POTASSIUM 50 MG PO TABS: 50.0000 mg | ORAL_TABLET | Freq: Every day | ORAL | 1 refills | Status: DC

## 2020-03-16 NOTE — Progress Notes (Signed)
Assessment & Plan:  1. Essential hypertension - Well controlled on current regimen.  - losartan (COZAAR) 50 MG tablet; Take 1 tablet (50 mg total) by mouth daily.  Dispense: 90 tablet; Refill: 1 - CMP14+EGFR  2. Mixed hyperlipidemia - Assessing for control today. Patient has been off of statin medication due to elevated liver enzymes when he had COVID-19. Diet and exercise encouraged.  - CMP14+EGFR - Lipid panel  3. Colon cancer screening - Ambulatory referral to Gastroenterology   Patient will be getting COVID-19 vaccines.   Return in about 6 months (around 09/15/2020) for follow-up of chronic medication conditions.  Hendricks Limes, MSN, APRN, FNP-C [facility]  Subjective:    Patient ID: Dakota Cooke, male    DOB: 1956-08-05, 64 y.o.   MRN: [id number]  Patient Care Team: Loman Brooklyn, FNP as PCP - General (Family Medicine)   Chief Complaint:  Chief Complaint  Patient presents with   Hypertension    3 month follow up    HPI: Dakota Cooke is a 64 y.o. male presenting on 03/16/2020 for Hypertension (3 month follow up)  Patient is still having oxygen delivered to his home. He has not been using it in quite some time. We have previously advised that he does not need it. He is asking that we do so again today. It is coming from [employer].   Hypertension: Patient here for follow-up of elevated blood pressure. He is exercising but admits he could do a lot more and is adherent to low salt diet.  Blood pressure is well controlled at home. Cardiac symptoms none. Cardiovascular risk factors: advanced age (older than 109 for men, 50 for women), dyslipidemia, hypertension, male gender and obesity (BMI >= 30 kg/m2). Use of agents associated with hypertension: none. History of target organ damage: none.  Hyperlipidemia: Patient presents with hyperlipidemia. His last labs showed Total cholesterol of 214, HDL 34, LDL 133,  Triglycerides 263. There is not a  family history of hyperlipidemia. There is not a family history of early ischemia heart disease. Patient has been off his statin for a couple of months now due to elevated liver enzymes s/p COVID-19.    New complaints: None  Social history:  Relevant past medical, surgical, family and social history reviewed and updated as indicated. Interim medical history since our last visit reviewed.  Allergies and medications reviewed and updated.  DATA REVIEWED: CHART IN EPIC  ROS: Negative unless specifically indicated above in HPI.    Current Outpatient Medications:    aspirin 81 MG EC tablet, Take 81 mg by mouth daily. Swallow whole., Disp: , Rfl:    losartan (COZAAR) 50 MG tablet, Take 1 tablet (50 mg total) by mouth daily., Disp: 90 tablet, Rfl: 1   No Known Allergies Past Medical History:  Diagnosis Date   BPH (benign prostatic hyperplasia)    Hyperlipidemia 10/15/2019   Hypertension    Personal history of covid-19 11/2019    Past Surgical History:  Procedure Laterality Date   BACK SURGERY  1991   lower thoracic spine fusion to L3 level    Social History   Socioeconomic History   Marital status: Married    Spouse name: Not on file   Number of children: Not on file   Years of education: Not on file   Highest education level: Not on file  Occupational History   Not on file  Tobacco Use   Smoking status: Former Smoker    Types: Cigarettes  Quit date: 54    Years since quitting: 32.4   Smokeless tobacco: Never Used  Substance and Sexual Activity   Alcohol use: Yes    Comment: occ   Drug use: Never   Sexual activity: Not on file  Other Topics Concern   Not on file  Social History Narrative   Not on file   Social Determinants of Health   Financial Resource Strain:    Difficulty of Paying Living Expenses:   Food Insecurity:    Worried About Charity fundraiser in the Last Year:    Arboriculturist in the Last Year:   Transportation  Needs:    Film/video editor (Medical):    Lack of Transportation (Non-Medical):   Physical Activity:    Days of Exercise per Week:    Minutes of Exercise per Session:   Stress:    Feeling of Stress :   Social Connections:    Frequency of Communication with Friends and Family:    Frequency of Social Gatherings with Friends and Family:    Attends Religious Services:    Active Member of Clubs or Organizations:    Attends Music therapist:    Marital Status:   Intimate Partner Violence:    Fear of Current or Ex-Partner:    Emotionally Abused:    Physically Abused:    Sexually Abused:         Objective:    BP (!) 145/86    Pulse 62    Temp 97.7 F (36.5 C) (Temporal)    Ht 5' 8"  (1.727 m)    Wt 204 lb 9.6 oz (92.8 kg)    SpO2 97%    BMI 31.11 kg/m   Wt Readings from Last 3 Encounters:  03/16/20 204 lb 9.6 oz (92.8 kg)  11/29/19 204 lb (92.5 kg)  10/14/19 204 lb 6.4 oz (92.7 kg)    Physical Exam Vitals reviewed.  Constitutional:      General: He is not in acute distress.    Appearance: Normal appearance. He is obese. He is not ill-appearing, toxic-appearing or diaphoretic.  HENT:     Head: Normocephalic and atraumatic.  Eyes:     General: No scleral icterus.       Right eye: No discharge.        Left eye: No discharge.     Conjunctiva/sclera: Conjunctivae normal.  Cardiovascular:     Rate and Rhythm: Normal rate and regular rhythm.     Heart sounds: Normal heart sounds. No murmur. No friction rub. No gallop.   Pulmonary:     Effort: Pulmonary effort is normal. No respiratory distress.     Breath sounds: Normal breath sounds. No stridor. No wheezing, rhonchi or rales.  Musculoskeletal:        General: Normal range of motion.     Cervical back: Normal range of motion.  Skin:    General: Skin is warm and dry.  Neurological:     Mental Status: He is alert and oriented to person, place, and time. Mental status is at baseline.    Psychiatric:        Mood and Affect: Mood normal.        Behavior: Behavior normal.        Thought Content: Thought content normal.        Judgment: Judgment normal.     Lab Results  Component Value Date   TSH 2.310 12/29/2019   Lab Results  Component Value  Date   WBC 5.5 12/29/2019   HGB 15.8 12/29/2019   HCT 47.5 12/29/2019   MCV 92 12/29/2019   PLT 155 12/29/2019   Lab Results  Component Value Date   NA 145 (H) 12/29/2019   K 4.6 12/29/2019   CO2 23 12/29/2019   GLUCOSE 92 12/29/2019   BUN 12 12/29/2019   CREATININE 0.87 12/29/2019   BILITOT 0.8 01/09/2020   ALKPHOS 60 01/09/2020   AST 46 (H) 01/09/2020   ALT 76 (H) 01/09/2020   PROT 6.8 01/09/2020   ALBUMIN 5.0 (H) 01/09/2020   CALCIUM 9.0 12/29/2019   ANIONGAP 11 12/08/2019   Lab Results  Component Value Date   CHOL 214 (H) 10/14/2019   Lab Results  Component Value Date   HDL 34 (L) 10/14/2019   Lab Results  Component Value Date   LDLCALC 133 (H) 10/14/2019   Lab Results  Component Value Date   TRIG 72 11/29/2019   Lab Results  Component Value Date   CHOLHDL 6.3 (H) 10/14/2019   Lab Results  Component Value Date   HGBA1C 5.7 10/14/2019

## 2020-03-17 ENCOUNTER — Other Ambulatory Visit: Payer: Self-pay | Admitting: Family Medicine

## 2020-03-17 DIAGNOSIS — E782 Mixed hyperlipidemia: Secondary | ICD-10-CM

## 2020-03-17 LAB — CMP14+EGFR
ALT: 41 IU/L (ref 0–44)
AST: 30 IU/L (ref 0–40)
Albumin/Globulin Ratio: 2.1 (ref 1.2–2.2)
Albumin: 4.8 g/dL (ref 3.8–4.8)
Alkaline Phosphatase: 63 IU/L (ref 48–121)
BUN/Creatinine Ratio: 17 (ref 10–24)
BUN: 17 mg/dL (ref 8–27)
Bilirubin Total: 0.5 mg/dL (ref 0.0–1.2)
CO2: 21 mmol/L (ref 20–29)
Calcium: 9.5 mg/dL (ref 8.6–10.2)
Chloride: 106 mmol/L (ref 96–106)
Creatinine, Ser: 1 mg/dL (ref 0.76–1.27)
GFR calc Af Amer: 90 mL/min/{1.73_m2} (ref >59)
GFR calc non Af Amer: 78 mL/min/{1.73_m2} (ref >59)
Globulin, Total: 2.3 g/dL (ref 1.5–4.5)
Glucose: 89 mg/dL (ref 65–99)
Potassium: 4.4 mmol/L (ref 3.5–5.2)
Sodium: 145 mmol/L — ABNORMAL HIGH (ref 134–144)
Total Protein: 7.1 g/dL (ref 6.0–8.5)

## 2020-03-17 LAB — LIPID PANEL
Chol/HDL Ratio: 7.1 ratio — ABNORMAL HIGH (ref 0.0–5.0)
Cholesterol, Total: 214 mg/dL — ABNORMAL HIGH (ref 100–199)
HDL: 30 mg/dL — ABNORMAL LOW (ref >39)
LDL Chol Calc (NIH): 131 mg/dL — ABNORMAL HIGH (ref 0–99)
Triglycerides: 296 mg/dL — ABNORMAL HIGH (ref 0–149)
VLDL Cholesterol Cal: 53 mg/dL — ABNORMAL HIGH (ref 5–40)

## 2020-03-17 MED ORDER — ATORVASTATIN CALCIUM 10 MG PO TABS: 10.0000 mg | ORAL_TABLET | Freq: Every day | ORAL | 2 refills | Status: DC

## 2020-03-25 ENCOUNTER — Telehealth: Payer: Self-pay | Admitting: Gastroenterology

## 2020-03-25 NOTE — Telephone Encounter (Signed)
Records reviewed - he had a TVA removed in 2015. Okay to proceed with direct colon at [facility] if no prohibitive conditions. Thanks

## 2020-03-25 NOTE — Telephone Encounter (Signed)
Good Morning Dr. Havery Moros,  We received a referral for this pt to have a colonoscopy.  [place].  We have received his records and will send for your review. Will you accept this pt?

## 2020-04-05 ENCOUNTER — Encounter: Payer: Self-pay | Admitting: Gastroenterology

## 2020-04-05 NOTE — Telephone Encounter (Signed)
Pt scheduled on 06-03-20 @ 11:30 am

## 2020-04-06 DIAGNOSIS — U071 COVID-19: Secondary | ICD-10-CM | POA: Diagnosis not present

## 2020-04-06 DIAGNOSIS — J9601 Acute respiratory failure with hypoxia: Secondary | ICD-10-CM | POA: Diagnosis not present

## 2020-04-06 DIAGNOSIS — J1282 Pneumonia due to coronavirus disease 2019: Secondary | ICD-10-CM | POA: Diagnosis not present

## 2020-04-23 ENCOUNTER — Telehealth: Payer: Self-pay | Admitting: Family Medicine

## 2020-04-23 NOTE — Telephone Encounter (Signed)
Please advise on this.  

## 2020-04-23 NOTE — Telephone Encounter (Signed)
Pt says that [place] co needs notification from our office that he no longer needs oxygen supply. He got a call from co today stating they never got notification from [place]. Please fax to [phone]

## 2020-04-24 ENCOUNTER — Other Ambulatory Visit: Payer: Self-pay | Admitting: Family Medicine

## 2020-04-24 DIAGNOSIS — E782 Mixed hyperlipidemia: Secondary | ICD-10-CM

## 2020-04-24 NOTE — Telephone Encounter (Signed)
Please provide this to them. We have told them this 2 or 3 times now.

## 2020-04-26 NOTE — Telephone Encounter (Signed)
Letter sent and patient aware.

## 2020-05-20 ENCOUNTER — Encounter: Payer: Self-pay | Admitting: Gastroenterology

## 2020-05-20 ENCOUNTER — Other Ambulatory Visit: Payer: Self-pay

## 2020-05-20 ENCOUNTER — Ambulatory Visit (AMBULATORY_SURGERY_CENTER): Payer: Self-pay

## 2020-05-20 VITALS — Ht 68.0 in | Wt 200.0 lb

## 2020-05-20 DIAGNOSIS — Z86010 Personal history of colonic polyps: Secondary | ICD-10-CM

## 2020-05-20 MED ORDER — SUTAB 1479-225-188 MG PO TABS: 1.0000 | ORAL_TABLET | ORAL | 0 refills | Status: DC

## 2020-05-20 NOTE — Progress Notes (Signed)
No egg or soy allergy known to patient  No issues with past sedation with any surgeries or procedures No intubation problems in the past  No FH of Malignant Hyperthermia No diet pills per patient No home 02 use per patient  No blood thinners per patient  Pt denies issues with constipation  No A fib or A flutter  COVID 19 guidelines implemented in PV today with Pt and RN  Coupon given to pt in [facility] today , Code to Pharmacy  COVID vaccines completed on 03/2020 per pt;  Due to the COVID-19 pandemic we are asking patients to follow these guidelines. Please only bring one care partner. Please be aware that your care partner may wait in the car in the parking lot or if they feel like they will be too hot to wait in the car, they may wait in the lobby on the 4th floor. All care partners are required to wear a mask the entire time (we do not have any that we can provide them), they need to practice social distancing, and we will do a Covid check for all patient's and care partners when you arrive. Also we will check their temperature and your temperature. If the care partner waits in their car they need to stay in the parking lot the entire time and we will call them on their cell phone when the patient is ready for discharge so they can bring the car to the front of the building. Also all patient's will need to wear a mask into building.  

## 2020-06-03 ENCOUNTER — Other Ambulatory Visit: Payer: Self-pay

## 2020-06-03 ENCOUNTER — Encounter: Payer: Self-pay | Admitting: Gastroenterology

## 2020-06-03 ENCOUNTER — Ambulatory Visit (AMBULATORY_SURGERY_CENTER): Payer: Medicare HMO | Admitting: Gastroenterology

## 2020-06-03 VITALS — BP 127/81 | HR 57 | Temp 97.5°F | Resp 18 | Ht 68.0 in | Wt 200.0 lb

## 2020-06-03 DIAGNOSIS — D120 Benign neoplasm of cecum: Secondary | ICD-10-CM

## 2020-06-03 DIAGNOSIS — Z86010 Personal history of colonic polyps: Secondary | ICD-10-CM | POA: Diagnosis not present

## 2020-06-03 DIAGNOSIS — D127 Benign neoplasm of rectosigmoid junction: Secondary | ICD-10-CM

## 2020-06-03 DIAGNOSIS — E785 Hyperlipidemia, unspecified: Secondary | ICD-10-CM | POA: Diagnosis not present

## 2020-06-03 DIAGNOSIS — D123 Benign neoplasm of transverse colon: Secondary | ICD-10-CM

## 2020-06-03 DIAGNOSIS — I10 Essential (primary) hypertension: Secondary | ICD-10-CM | POA: Diagnosis not present

## 2020-06-03 DIAGNOSIS — Z1211 Encounter for screening for malignant neoplasm of colon: Secondary | ICD-10-CM | POA: Diagnosis not present

## 2020-06-03 DIAGNOSIS — D122 Benign neoplasm of ascending colon: Secondary | ICD-10-CM

## 2020-06-03 MED ORDER — SODIUM CHLORIDE 0.9 % IV SOLN: 500.0000 mL | Freq: Once | INTRAVENOUS | Status: DC

## 2020-06-03 NOTE — Patient Instructions (Signed)
Impression/Recommendations: ° °Polyp handout given to patient. °Diverticulosis handout given to patient. ° °Resume previous diet. °Continue present medications. °Await pathology results. ° °YOU HAD AN ENDOSCOPIC PROCEDURE TODAY AT THE [facility]:   Refer to the procedure report that was given to you for any specific questions about what was found during the examination.  If the procedure report does not answer your questions, please call your gastroenterologist to clarify.  If you requested that your care partner not be given the details of your procedure findings, then the procedure report has been included in a sealed envelope for you to review at your convenience later. ° °YOU SHOULD EXPECT: Some feelings of bloating in the abdomen. Passage of more gas than usual.  Walking can help get rid of the air that was put into your GI tract during the procedure and reduce the bloating. If you had a lower endoscopy (such as a colonoscopy or flexible sigmoidoscopy) you may notice spotting of blood in your stool or on the toilet paper. If you underwent a bowel prep for your procedure, you may not have a normal bowel movement for a few days. ° °Please Note:  You might notice some irritation and congestion in your nose or some drainage.  This is from the oxygen used during your procedure.  There is no need for concern and it should clear up in a day or so. ° °SYMPTOMS TO REPORT IMMEDIATELY: ° °· Following lower endoscopy (colonoscopy or flexible sigmoidoscopy): ° Excessive amounts of blood in the stool ° Significant tenderness or worsening of abdominal pains ° Swelling of the abdomen that is new, acute ° Fever of 100°F or higher ° °For urgent or emergent issues, a gastroenterologist can be reached at any hour by calling [phone]. °Do not use MyChart messaging for urgent concerns.  ° ° °DIET:  We do recommend a small meal at first, but then you may proceed to your regular diet.  Drink plenty of fluids but  you should avoid alcoholic beverages for 24 hours. ° °ACTIVITY:  You should plan to take it easy for the rest of today and you should NOT DRIVE or use heavy machinery until tomorrow (because of the sedation medicines used during the test).   ° °FOLLOW UP: °Our staff will call the number listed on your records 48-72 hours following your procedure to check on you and address any questions or concerns that you may have regarding the information given to you following your procedure. If we do not reach you, we will leave a message.  We will attempt to reach you two times.  During this call, we will ask if you have developed any symptoms of COVID 19. If you develop any symptoms (ie: fever, flu-like symptoms, shortness of breath, cough etc.) before then, please call [phone].  If you test positive for Covid 19 in the 2 weeks post procedure, please call and report this information to us.   ° °If any biopsies were taken you will be contacted by phone or by letter within the next 1-3 weeks.  Please call us at [phone] if you have not heard about the biopsies in 3 weeks.  ° ° °SIGNATURES/CONFIDENTIALITY: °You and/or your care partner have signed paperwork which will be entered into your electronic medical record.  These signatures attest to the fact that that the information above on your After Visit Summary has been reviewed and is understood.  Full responsibility of the confidentiality of this discharge information lies with   you and/or your care-partner. °

## 2020-06-03 NOTE — Progress Notes (Signed)
Report given to PACU, vss 

## 2020-06-03 NOTE — Op Note (Signed)
[facility] Patient Name: Dakota Cooke Procedure Date: 06/03/2020 12:01 PM MRN: [id number] Endoscopist: Remo Lipps P. Havery Moros , MD Age: 64 Referring MD:  Date of Birth: 01-Sep-1956 Gender: Male Account #: [id number] Procedure:                Colonoscopy Indications:              High risk colon cancer surveillance: Personal                            history of colonic polyps - history of 2 TVAs in                            2015 Medicines:                Monitored Anesthesia Care Procedure:                Pre-Anesthesia Assessment:                           - Prior to the procedure, a History and Physical                            was performed, and patient medications and                            allergies were reviewed. The patient's tolerance of                            previous anesthesia was also reviewed. The risks                            and benefits of the procedure and the sedation                            options and risks were discussed with the patient.                            All questions were answered, and informed consent                            was obtained. Prior Anticoagulants: The patient has                            taken no previous anticoagulant or antiplatelet                            agents. ASA Grade Assessment: II - A patient with                            mild systemic disease. After reviewing the risks                            and benefits, the patient was deemed in  satisfactory condition to undergo the procedure.                           After obtaining informed consent, the colonoscope                            was passed under direct vision. Throughout the                            procedure, the patient's blood pressure, pulse, and                            oxygen saturations were monitored continuously. The                            Colonoscope was introduced through the anus and                             advanced to the the cecum, identified by                            appendiceal orifice and ileocecal valve. The                            colonoscopy was performed without difficulty. The                            patient tolerated the procedure well. The quality                            of the bowel preparation was adequate. The                            ileocecal valve, appendiceal orifice, and rectum                            were photographed. Scope In: 12:12:24 PM Scope Out: 27:51:70 PM Scope Withdrawal Time: 0 hours 21 minutes 46 seconds  Total Procedure Duration: 0 hours 24 minutes 22 seconds  Findings:                 The perianal and digital rectal examinations were                            normal.                           Four sessile polyps were found in the cecum. The                            polyps were 2 to 4 mm in size. These polyps were                            removed with a cold snare. Resection and retrieval  were complete.                           A 4 mm polyp was found in the ascending colon. The                            polyp was sessile. The polyp was removed with a                            cold snare. Resection and retrieval were complete.                           A 3 mm polyp was found in the hepatic flexure. The                            polyp was sessile. The polyp was removed with a                            cold snare. Resection and retrieval were complete.                           A 4 mm polyp was found in the recto-sigmoid colon.                            The polyp was sessile. The polyp was removed with a                            cold snare. Resection and retrieval were complete.                           Multiple small-mouthed diverticula were found in                            the sigmoid colon.                           The exam was otherwise without abnormality. Complications:             No immediate complications. Estimated blood loss:                            Minimal. Estimated Blood Loss:     Estimated blood loss was minimal. Impression:               - Four 2 to 4 mm polyps in the cecum, removed with                            a cold snare. Resected and retrieved.                           - One 4 mm polyp in the ascending colon, removed                            with  a cold snare. Resected and retrieved.                           - One 3 mm polyp at the hepatic flexure, removed                            with a cold snare. Resected and retrieved.                           - One 4 mm polyp at the recto-sigmoid colon,                            removed with a cold snare. Resected and retrieved.                           - Diverticulosis in the sigmoid colon.                           - The examination was otherwise normal. Recommendation:           - Patient has a contact number available for                            emergencies. The signs and symptoms of potential                            delayed complications were discussed with the                            patient. Return to normal activities tomorrow.                            Written discharge instructions were provided to the                            patient.                           - Resume previous diet.                           - Continue present medications.                           - Await pathology results. Remo Lipps P. Aniruddh Ciavarella, MD 06/03/2020 12:53:11 PM This report has been signed electronically.

## 2020-06-03 NOTE — Progress Notes (Signed)
C. Wall CRNA reported that IV had clotted off, and he had D/C'd the original IV.  IV placed L. AC, C. Wall CRNA.

## 2020-06-03 NOTE — Progress Notes (Signed)
Called to room to assist during endoscopic procedure.  Patient ID and intended procedure confirmed with present staff. Received instructions for my participation in the procedure from the performing physician.  

## 2020-06-07 ENCOUNTER — Telehealth: Payer: Self-pay | Admitting: *Deleted

## 2020-06-07 NOTE — Telephone Encounter (Signed)
Attempted 2nd f/u phone call. No answer. Left message.  °

## 2020-06-07 NOTE — Telephone Encounter (Signed)
First follow up call made, left message. 

## 2020-06-09 ENCOUNTER — Encounter: Payer: Self-pay | Admitting: Gastroenterology

## 2020-08-31 ENCOUNTER — Other Ambulatory Visit: Payer: Self-pay | Admitting: Family Medicine

## 2020-08-31 DIAGNOSIS — E782 Mixed hyperlipidemia: Secondary | ICD-10-CM

## 2020-09-27 ENCOUNTER — Encounter: Payer: Self-pay | Admitting: *Deleted

## 2020-09-27 ENCOUNTER — Other Ambulatory Visit: Payer: Self-pay | Admitting: Family Medicine

## 2020-09-27 DIAGNOSIS — I10 Essential (primary) hypertension: Secondary | ICD-10-CM

## 2020-09-28 DIAGNOSIS — R350 Frequency of micturition: Secondary | ICD-10-CM | POA: Diagnosis not present

## 2020-09-28 DIAGNOSIS — N390 Urinary tract infection, site not specified: Secondary | ICD-10-CM | POA: Diagnosis not present

## 2020-10-19 ENCOUNTER — Ambulatory Visit: Payer: Medicare HMO | Admitting: Family Medicine

## 2020-11-03 ENCOUNTER — Ambulatory Visit: Payer: Medicare HMO | Admitting: Family Medicine

## 2020-11-04 ENCOUNTER — Other Ambulatory Visit: Payer: Self-pay

## 2020-11-04 ENCOUNTER — Encounter: Payer: Self-pay | Admitting: Family Medicine

## 2020-11-04 ENCOUNTER — Ambulatory Visit (INDEPENDENT_AMBULATORY_CARE_PROVIDER_SITE_OTHER): Payer: Medicare HMO | Admitting: Family Medicine

## 2020-11-04 VITALS — BP 147/81 | HR 54 | Temp 98.1°F | Ht 68.0 in | Wt 200.6 lb

## 2020-11-04 DIAGNOSIS — I10 Essential (primary) hypertension: Secondary | ICD-10-CM | POA: Diagnosis not present

## 2020-11-04 DIAGNOSIS — N401 Enlarged prostate with lower urinary tract symptoms: Secondary | ICD-10-CM | POA: Diagnosis not present

## 2020-11-04 DIAGNOSIS — E782 Mixed hyperlipidemia: Secondary | ICD-10-CM | POA: Diagnosis not present

## 2020-11-04 DIAGNOSIS — R351 Nocturia: Secondary | ICD-10-CM | POA: Diagnosis not present

## 2020-11-04 MED ORDER — ATORVASTATIN CALCIUM 10 MG PO TABS: ORAL_TABLET | ORAL | 1 refills | Status: DC

## 2020-11-04 MED ORDER — LOSARTAN POTASSIUM 100 MG PO TABS: 100.0000 mg | ORAL_TABLET | Freq: Every day | ORAL | 2 refills | Status: DC

## 2020-11-04 MED ORDER — TAMSULOSIN HCL 0.4 MG PO CAPS: 0.4000 mg | ORAL_CAPSULE | Freq: Every day | ORAL | 3 refills | Status: DC

## 2020-11-04 NOTE — Patient Instructions (Signed)

## 2020-11-04 NOTE — Progress Notes (Signed)
Assessment & Plan:  1. Essential hypertension - Uncontrolled. Increased Losartan from 50 mg to 100 mg daily. Patient to monitor BP at home and keep a log to bring with him to his next appointment. - losartan (COZAAR) 100 MG tablet; Take 1 tablet (100 mg total) by mouth daily.  Dispense: 30 tablet; Refill: 2 - CMP14+EGFR; Future - Lipid panel; Future - CBC with Differential/Platelet; Future  2. Mixed hyperlipidemia - Labs to assess. - atorvastatin (LIPITOR) 10 MG tablet; TAKE 1 TABLET BY MOUTH ONCE DAILY AT  6  PM  Dispense: 90 tablet; Refill: 1 - CMP14+EGFR; Future - Lipid panel; Future  3. Benign prostatic hyperplasia with nocturia - Started Flomax.  - tamsulosin (FLOMAX) 0.4 MG CAPS capsule; Take 1 capsule (0.4 mg total) by mouth daily after supper.  Dispense: 30 capsule; Refill: 3 - Ambulatory referral to Urology   Return in about 6 weeks (around 12/16/2020) for HTN & BPH.  Hendricks Limes, MSN, APRN, FNP-C [facility]  Subjective:    Patient ID: Dakota Cooke, male    DOB: January 04, 1956, 65 y.o.   MRN: [id number]  Patient Care Team: Loman Brooklyn, FNP as PCP - General (Family Medicine)   Chief Complaint:  Chief Complaint  Patient presents with  . Hypertension    6 month follow up of chronic medical conditions     HPI: Dakota Cooke is a 64 y.o. male presenting on 11/04/2020 for Hypertension (6 month follow up of chronic medical conditions/)  Hypertension: Patient reports he checks his blood pressure at home maybe once a month.  His systolic is typically in the 188C and his diastolic is in the 16S or 90s.  Hyperlipidemia: Patient has been taking atorvastatin since the last time his lipid panel was completed and tolerating it well.  New complaints: Patient's concern is that he is getting up to urinate 10-12 times per night.  He does not go a lot each time but does always go.  He does feel his stream is weaker than it used to be but it is not split  and he denies any dribbling.  He was treated for a UTI in December.    Social history:  Relevant past medical, surgical, family and social history reviewed and updated as indicated. Interim medical history since our last visit reviewed.  Allergies and medications reviewed and updated.  DATA REVIEWED: CHART IN EPIC  ROS: Negative unless specifically indicated above in HPI.    Current Outpatient Medications:  .  aspirin 81 MG EC tablet, Take 81 mg by mouth daily. Swallow whole., Disp: , Rfl:  .  atorvastatin (LIPITOR) 10 MG tablet, TAKE 1 TABLET BY MOUTH ONCE DAILY AT  6  PM, Disp: 90 tablet, Rfl: 0 .  losartan (COZAAR) 50 MG tablet, Take 1 tablet by mouth once daily, Disp: 90 tablet, Rfl: 0   No Known Allergies Past Medical History:  Diagnosis Date  . BPH (benign prostatic hyperplasia)   . Hyperlipidemia 10/15/2019  . Hypertension   . Personal history of COVID-19 11/2019    Past Surgical History:  Procedure Laterality Date  . BACK SURGERY  1991   lower thoracic spine fusion to L3 level  . COLONOSCOPY  2015   multiple TA's  . POLYPECTOMY  2015   multiple TA's    Social History   Socioeconomic History  . Marital status: Married    Spouse name: Not on file  . Number of children: Not on file  . Years of  education: Not on file  . Highest education level: Not on file  Occupational History  . Not on file  Tobacco Use  . Smoking status: Former Smoker    Types: Cigarettes    Quit date: 1989    Years since quitting: 33.0  . Smokeless tobacco: Never Used  Vaping Use  . Vaping Use: Never used  Substance and Sexual Activity  . Alcohol use: Yes    Comment: occassionally  . Drug use: Never  . Sexual activity: Not on file  Other Topics Concern  . Not on file  Social History Narrative  . Not on file   Social Determinants of Health   Financial Resource Strain: Not on file  Food Insecurity: Not on file  Transportation Needs: Not on file  Physical Activity: Not on file   Stress: Not on file  Social Connections: Not on file  Intimate Partner Violence: Not on file        Objective:    BP (!) 147/81   Pulse (!) 54   Temp 98.1 F (36.7 C) (Temporal)   Ht 5' 8"  (1.727 m)   Wt 200 lb 9.6 oz (91 kg)   SpO2 96%   BMI 30.50 kg/m   Wt Readings from Last 3 Encounters:  11/04/20 200 lb 9.6 oz (91 kg)  06/03/20 200 lb (90.7 kg)  05/20/20 200 lb (90.7 kg)    Physical Exam Vitals reviewed.  Constitutional:      General: He is not in acute distress.    Appearance: Normal appearance. He is obese. He is not ill-appearing, toxic-appearing or diaphoretic.  HENT:     Head: Normocephalic and atraumatic.  Eyes:     General: No scleral icterus.       Right eye: No discharge.        Left eye: No discharge.     Conjunctiva/sclera: Conjunctivae normal.  Cardiovascular:     Rate and Rhythm: Normal rate and regular rhythm.     Heart sounds: Normal heart sounds. No murmur heard. No friction rub. No gallop.   Pulmonary:     Effort: Pulmonary effort is normal. No respiratory distress.     Breath sounds: Normal breath sounds. No stridor. No wheezing, rhonchi or rales.  Musculoskeletal:        General: Normal range of motion.     Cervical back: Normal range of motion.  Skin:    General: Skin is warm and dry.  Neurological:     Mental Status: He is alert and oriented to person, place, and time. Mental status is at baseline.  Psychiatric:        Mood and Affect: Mood normal.        Behavior: Behavior normal.        Thought Content: Thought content normal.        Judgment: Judgment normal.     Lab Results  Component Value Date   TSH 2.310 12/29/2019   Lab Results  Component Value Date   WBC 5.5 12/29/2019   HGB 15.8 12/29/2019   HCT 47.5 12/29/2019   MCV 92 12/29/2019   PLT 155 12/29/2019   Lab Results  Component Value Date   NA 145 (H) 03/16/2020   K 4.4 03/16/2020   CO2 21 03/16/2020   GLUCOSE 89 03/16/2020   BUN 17 03/16/2020   CREATININE  1.00 03/16/2020   BILITOT 0.5 03/16/2020   ALKPHOS 63 03/16/2020   AST 30 03/16/2020   ALT 41 03/16/2020   PROT  7.1 03/16/2020   ALBUMIN 4.8 03/16/2020   CALCIUM 9.5 03/16/2020   ANIONGAP 11 12/08/2019   Lab Results  Component Value Date   CHOL 214 (H) 03/16/2020   Lab Results  Component Value Date   HDL 30 (L) 03/16/2020   Lab Results  Component Value Date   LDLCALC 131 (H) 03/16/2020   Lab Results  Component Value Date   TRIG 296 (H) 03/16/2020   Lab Results  Component Value Date   CHOLHDL 7.1 (H) 03/16/2020   Lab Results  Component Value Date   HGBA1C 5.7 10/14/2019

## 2020-11-08 ENCOUNTER — Other Ambulatory Visit: Payer: Self-pay

## 2020-11-08 ENCOUNTER — Other Ambulatory Visit: Payer: Medicare HMO

## 2020-11-08 DIAGNOSIS — I10 Essential (primary) hypertension: Secondary | ICD-10-CM | POA: Diagnosis not present

## 2020-11-08 DIAGNOSIS — E782 Mixed hyperlipidemia: Secondary | ICD-10-CM

## 2020-11-08 LAB — CMP14+EGFR
ALT: 23 IU/L (ref 0–44)
AST: 22 IU/L (ref 0–40)
Albumin/Globulin Ratio: 2 (ref 1.2–2.2)
Albumin: 4.6 g/dL (ref 3.8–4.8)
Alkaline Phosphatase: 65 IU/L (ref 44–121)
BUN/Creatinine Ratio: 13 (ref 10–24)
BUN: 12 mg/dL (ref 8–27)
Bilirubin Total: 0.8 mg/dL (ref 0.0–1.2)
CO2: 24 mmol/L (ref 20–29)
Calcium: 9.2 mg/dL (ref 8.6–10.2)
Chloride: 104 mmol/L (ref 96–106)
Creatinine, Ser: 0.93 mg/dL (ref 0.76–1.27)
GFR calc Af Amer: 99 mL/min/{1.73_m2} (ref >59)
GFR calc non Af Amer: 85 mL/min/{1.73_m2} (ref >59)
Globulin, Total: 2.3 g/dL (ref 1.5–4.5)
Glucose: 101 mg/dL — ABNORMAL HIGH (ref 65–99)
Potassium: 4.4 mmol/L (ref 3.5–5.2)
Sodium: 142 mmol/L (ref 134–144)
Total Protein: 6.9 g/dL (ref 6.0–8.5)

## 2020-11-08 LAB — CBC WITH DIFFERENTIAL/PLATELET
Basophils Absolute: 0 10*3/uL (ref 0.0–0.2)
Basos: 1 %
EOS (ABSOLUTE): 0.1 10*3/uL (ref 0.0–0.4)
Eos: 2 %
Hematocrit: 45.9 % (ref 37.5–51.0)
Hemoglobin: 15.4 g/dL (ref 13.0–17.7)
Immature Grans (Abs): 0 10*3/uL (ref 0.0–0.1)
Immature Granulocytes: 0 %
Lymphocytes Absolute: 2.4 10*3/uL (ref 0.7–3.1)
Lymphs: 34 %
MCH: 29.3 pg (ref 26.6–33.0)
MCHC: 33.6 g/dL (ref 31.5–35.7)
MCV: 87 fL (ref 79–97)
Monocytes Absolute: 0.6 10*3/uL (ref 0.1–0.9)
Monocytes: 9 %
Neutrophils Absolute: 3.8 10*3/uL (ref 1.4–7.0)
Neutrophils: 54 %
Platelets: 184 10*3/uL (ref 150–450)
RBC: 5.25 x10E6/uL (ref 4.14–5.80)
RDW: 12.9 % (ref 11.6–15.4)
WBC: 6.9 10*3/uL (ref 3.4–10.8)

## 2020-11-08 LAB — LIPID PANEL
Chol/HDL Ratio: 4.4 ratio (ref 0.0–5.0)
Cholesterol, Total: 142 mg/dL (ref 100–199)
HDL: 32 mg/dL — ABNORMAL LOW (ref >39)
LDL Chol Calc (NIH): 80 mg/dL (ref 0–99)
Triglycerides: 173 mg/dL — ABNORMAL HIGH (ref 0–149)
VLDL Cholesterol Cal: 30 mg/dL (ref 5–40)

## 2020-11-16 ENCOUNTER — Telehealth: Payer: Self-pay

## 2020-11-16 NOTE — Telephone Encounter (Signed)
Patient is aware of labs. Verbal Dakota Cooke

## 2020-12-16 ENCOUNTER — Other Ambulatory Visit: Payer: Self-pay

## 2020-12-16 ENCOUNTER — Encounter: Payer: Self-pay | Admitting: Family Medicine

## 2020-12-16 ENCOUNTER — Ambulatory Visit (INDEPENDENT_AMBULATORY_CARE_PROVIDER_SITE_OTHER): Payer: Medicare HMO | Admitting: Family Medicine

## 2020-12-16 VITALS — BP 135/78 | HR 62 | Temp 97.8°F | Ht 68.0 in | Wt 206.2 lb

## 2020-12-16 DIAGNOSIS — I10 Essential (primary) hypertension: Secondary | ICD-10-CM | POA: Diagnosis not present

## 2020-12-16 DIAGNOSIS — N401 Enlarged prostate with lower urinary tract symptoms: Secondary | ICD-10-CM | POA: Diagnosis not present

## 2020-12-16 DIAGNOSIS — R351 Nocturia: Secondary | ICD-10-CM | POA: Diagnosis not present

## 2020-12-16 DIAGNOSIS — Z0000 Encounter for general adult medical examination without abnormal findings: Secondary | ICD-10-CM

## 2020-12-16 NOTE — Patient Instructions (Signed)
Check on shingles vaccines at [facility] (Shingrix).

## 2020-12-16 NOTE — Progress Notes (Signed)
Assessment & Plan:  1. Essential hypertension Well controlled on current regimen.   2. Benign prostatic hyperplasia with nocturia Uncontrolled.  Patient has an appointment to establish care with urology next week.  3. Healthcare maintenance Patient is going to check on the price of Shingrix with the pharmacy.   Return in about 6 months (around 06/18/2021) for follow-up of chronic medication conditions.  Hendricks Limes, MSN, APRN, FNP-C [facility]  Subjective:    Patient ID: Dakota Cooke, male    DOB: 20-Jun-1956, 65 y.o.   MRN: [id number]  Patient Care Team: Loman Brooklyn, FNP as PCP - General (Family Medicine)   Chief Complaint:  Chief Complaint  Patient presents with  . Hypertension  . Benign Prostatic Hypertrophy    HPI: Dakota Cooke is a 65 y.o. male presenting on 12/16/2020 for Hypertension and Benign Prostatic Hypertrophy  Hypertension: Patient's losartan was increased from 50 mg to 100 mg 6 weeks ago.  He has been spot checking his blood pressure at home and reports it is normally around 135/80.  BPH: Patient was started on Flomax 6 weeks ago.  He reports the number of times he has to get up to urinate has decreased from 10-12 down to 6-8.  He does have an appointment with the urologist next week to get established.  New complaints: None  Social history:  Relevant past medical, surgical, family and social history reviewed and updated as indicated. Interim medical history since our last visit reviewed.  Allergies and medications reviewed and updated.  DATA REVIEWED: CHART IN EPIC  ROS: Negative unless specifically indicated above in HPI.    Current Outpatient Medications:  .  aspirin 81 MG EC tablet, Take 81 mg by mouth daily. Swallow whole., Disp: , Rfl:  .  atorvastatin (LIPITOR) 10 MG tablet, TAKE 1 TABLET BY MOUTH ONCE DAILY AT  6  PM, Disp: 90 tablet, Rfl: 1 .  losartan (COZAAR) 100 MG tablet, Take 1 tablet (100 mg total) by  mouth daily., Disp: 30 tablet, Rfl: 2 .  tamsulosin (FLOMAX) 0.4 MG CAPS capsule, Take 1 capsule (0.4 mg total) by mouth daily after supper., Disp: 30 capsule, Rfl: 3   No Known Allergies Past Medical History:  Diagnosis Date  . BPH (benign prostatic hyperplasia)   . Hyperlipidemia 10/15/2019  . Hypertension   . Personal history of COVID-19 11/2019    Past Surgical History:  Procedure Laterality Date  . BACK SURGERY  1991   lower thoracic spine fusion to L3 level  . COLONOSCOPY  2015   multiple TA's  . POLYPECTOMY  2015   multiple TA's    Social History   Socioeconomic History  . Marital status: Married    Spouse name: Not on file  . Number of children: Not on file  . Years of education: Not on file  . Highest education level: Not on file  Occupational History  . Not on file  Tobacco Use  . Smoking status: Former Smoker    Types: Cigarettes    Quit date: 1989    Years since quitting: 33.1  . Smokeless tobacco: Never Used  Vaping Use  . Vaping Use: Never used  Substance and Sexual Activity  . Alcohol use: Yes    Comment: occassionally  . Drug use: Never  . Sexual activity: Not on file  Other Topics Concern  . Not on file  Social History Narrative  . Not on file   Social Determinants of Health  Financial Resource Strain: Not on file  Food Insecurity: Not on file  Transportation Needs: Not on file  Physical Activity: Not on file  Stress: Not on file  Social Connections: Not on file  Intimate Partner Violence: Not on file        Objective:    BP 135/78   Pulse 62   Temp 97.8 F (36.6 C)   Ht 5\' 8"  (1.727 m)   Wt 206 lb 3.2 oz (93.5 kg)   SpO2 98%   BMI 31.35 kg/m   Wt Readings from Last 3 Encounters:  12/16/20 206 lb 3.2 oz (93.5 kg)  11/04/20 200 lb 9.6 oz (91 kg)  06/03/20 200 lb (90.7 kg)    Physical Exam Vitals reviewed.  Constitutional:      General: He is not in acute distress.    Appearance: Normal appearance. He is obese. He is  not ill-appearing, toxic-appearing or diaphoretic.  HENT:     Head: Normocephalic and atraumatic.  Eyes:     General: No scleral icterus.       Right eye: No discharge.        Left eye: No discharge.     Conjunctiva/sclera: Conjunctivae normal.  Cardiovascular:     Rate and Rhythm: Normal rate and regular rhythm.     Heart sounds: Normal heart sounds. No murmur heard. No friction rub. No gallop.   Pulmonary:     Effort: Pulmonary effort is normal. No respiratory distress.     Breath sounds: Normal breath sounds. No stridor. No wheezing, rhonchi or rales.  Musculoskeletal:        General: Normal range of motion.     Cervical back: Normal range of motion.  Skin:    General: Skin is warm and dry.  Neurological:     Mental Status: He is alert and oriented to person, place, and time. Mental status is at baseline.  Psychiatric:        Mood and Affect: Mood normal.        Behavior: Behavior normal.        Thought Content: Thought content normal.        Judgment: Judgment normal.     Lab Results  Component Value Date   TSH 2.310 12/29/2019   Lab Results  Component Value Date   WBC 6.9 11/08/2020   HGB 15.4 11/08/2020   HCT 45.9 11/08/2020   MCV 87 11/08/2020   PLT 184 11/08/2020   Lab Results  Component Value Date   NA 142 11/08/2020   K 4.4 11/08/2020   CO2 24 11/08/2020   GLUCOSE 101 (H) 11/08/2020   BUN 12 11/08/2020   CREATININE 0.93 11/08/2020   BILITOT 0.8 11/08/2020   ALKPHOS 65 11/08/2020   AST 22 11/08/2020   ALT 23 11/08/2020   PROT 6.9 11/08/2020   ALBUMIN 4.6 11/08/2020   CALCIUM 9.2 11/08/2020   ANIONGAP 11 12/08/2019   Lab Results  Component Value Date   CHOL 142 11/08/2020   Lab Results  Component Value Date   HDL 32 (L) 11/08/2020   Lab Results  Component Value Date   LDLCALC 80 11/08/2020   Lab Results  Component Value Date   TRIG 173 (H) 11/08/2020   Lab Results  Component Value Date   CHOLHDL 4.4 11/08/2020   Lab Results   Component Value Date   HGBA1C 5.7 10/14/2019

## 2020-12-20 DIAGNOSIS — E785 Hyperlipidemia, unspecified: Secondary | ICD-10-CM | POA: Diagnosis not present

## 2020-12-20 DIAGNOSIS — E669 Obesity, unspecified: Secondary | ICD-10-CM | POA: Diagnosis not present

## 2020-12-20 DIAGNOSIS — N400 Enlarged prostate without lower urinary tract symptoms: Secondary | ICD-10-CM | POA: Diagnosis not present

## 2020-12-20 DIAGNOSIS — I10 Essential (primary) hypertension: Secondary | ICD-10-CM | POA: Diagnosis not present

## 2020-12-20 DIAGNOSIS — Z7982 Long term (current) use of aspirin: Secondary | ICD-10-CM | POA: Diagnosis not present

## 2020-12-20 DIAGNOSIS — Z87891 Personal history of nicotine dependence: Secondary | ICD-10-CM | POA: Diagnosis not present

## 2020-12-20 DIAGNOSIS — Z008 Encounter for other general examination: Secondary | ICD-10-CM | POA: Diagnosis not present

## 2020-12-20 DIAGNOSIS — Z8249 Family history of ischemic heart disease and other diseases of the circulatory system: Secondary | ICD-10-CM | POA: Diagnosis not present

## 2020-12-20 NOTE — Progress Notes (Signed)
H&P  Chief Complaint: Voiding issues  History of Present Illness: 65 yo male w/ longstanding LUTS--never seen by a Urologist before.  He was started on tamsulosin a few weeks ago.  He does not think this helped his symptoms significantly.  He was treated for recent asymptomatic urinary tract infection.  He denies gross hematuria currently, dysuria, or significant incontinence.  PSA was checked in December 2020 and was 1.5.  IPSS Questionnaire (AUA-7): Over the past month.   1)  How often have you had a sensation of not emptying your bladder completely after you finish urinating?  4 - More than half the time  2)  How often have you had to urinate again less than two hours after you finished urinating? 5 - Almost always  3)  How often have you found you stopped and started again several times when you urinated?  0 - Not at all  4) How difficult have you found it to postpone urination?  5 - Almost always  5) How often have you had a weak urinary stream?  0 - Not at all  6) How often have you had to push or strain to begin urination?  0 - Not at all  7) How many times did you most typically get up to urinate from the time you went to bed until the time you got up in the morning?  5 - 5+ times  Total score:  0-7 mildly symptomatic   8-19 moderately symptomatic   20-35 severely symptomatic   IPSS 19  Past Medical History:  Diagnosis Date  . BPH (benign prostatic hyperplasia)   . Hyperlipidemia 10/15/2019  . Hypertension   . Personal history of COVID-19 11/2019    Past Surgical History:  Procedure Laterality Date  . BACK SURGERY  1991   lower thoracic spine fusion to L3 level  . COLONOSCOPY  2015   multiple TA's  . POLYPECTOMY  2015   multiple TA's    Home Medications:  Allergies as of 12/21/2020   No Known Allergies     Medication List       Accurate as of December 20, 2020  8:16 PM. If you have any questions, ask your nurse or doctor.        aspirin 81 MG EC tablet Take 81  mg by mouth daily. Swallow whole.   atorvastatin 10 MG tablet Commonly known as: LIPITOR TAKE 1 TABLET BY MOUTH ONCE DAILY AT  6  PM   losartan 100 MG tablet Commonly known as: COZAAR Take 1 tablet (100 mg total) by mouth daily.   tamsulosin 0.4 MG Caps capsule Commonly known as: FLOMAX Take 1 capsule (0.4 mg total) by mouth daily after supper.       Allergies: No Known Allergies  Family History  Problem Relation Age of Onset  . Hypertension Mother   . Kidney cancer Mother   . Colon cancer Mother        passed at age 66  . Kidney disease Mother        89 when passed from kidney cancer  . Parkinson's disease Father   . Breast cancer Sister 19  . Aneurysm Maternal Grandmother        brain  . Hypertension Son   . Colon cancer Maternal Uncle 55  . Colon polyps Neg Hx   . Rectal cancer Neg Hx   . Esophageal cancer Neg Hx   . Stomach cancer Neg Hx     Social History:  reports that he quit smoking about 33 years ago. His smoking use included cigarettes. He has never used smokeless tobacco. He reports current alcohol use. He reports that he does not use drugs.  ROS: A complete review of systems was performed.  All systems are negative except for pertinent findings as noted.  Physical Exam:  Vital signs in last 24 hours: There were no vitals taken for this visit. Constitutional:  Alert and oriented, No acute distress Cardiovascular: Regular rate  Respiratory: Normal respiratory effort GI: Abdomen is soft, nontender, nondistended, no abdominal masses. No CVAT.  Genitourinary: Normal male phallus, testes are descended bilaterally and non-tender and without masses, scrotum is normal in appearance without lesions or masses, perineum is normal on inspection.  Prostate 30 g, nonnodular, nontender Lymphatic: No lymphadenopathy Neurologic: Grossly intact, no focal deficits Psychiatric: Normal mood and affect  I have reviewed prior pt notes  I have reviewed notes from  referring/previous physicians  I have reviewed urinalysis results  I have reviewed prior PSA results     Impression/Assessment:  BPH with significant lower urinary tract symptoms as well as residual urine volume of 192 mL.  Not terribly improved with a single dose of tamsulosin a day.  Prostate just slightly enlarged on exam, he has a history of normal PSA 2 years ago  Plan:  1.  I will add an extra tamsulosin daily for him  2.  I will have him come back in about 1 month.  If he is still significantly symptomatic I will perform cystoscopy.

## 2020-12-21 ENCOUNTER — Encounter: Payer: Self-pay | Admitting: Urology

## 2020-12-21 ENCOUNTER — Ambulatory Visit (INDEPENDENT_AMBULATORY_CARE_PROVIDER_SITE_OTHER): Payer: Medicare HMO | Admitting: Urology

## 2020-12-21 ENCOUNTER — Other Ambulatory Visit: Payer: Self-pay

## 2020-12-21 VITALS — BP 146/85 | HR 51 | Temp 98.0°F | Ht 68.0 in | Wt 204.0 lb

## 2020-12-21 DIAGNOSIS — R3915 Urgency of urination: Secondary | ICD-10-CM | POA: Diagnosis not present

## 2020-12-21 DIAGNOSIS — R350 Frequency of micturition: Secondary | ICD-10-CM

## 2020-12-21 DIAGNOSIS — N400 Enlarged prostate without lower urinary tract symptoms: Secondary | ICD-10-CM

## 2020-12-21 DIAGNOSIS — R351 Nocturia: Secondary | ICD-10-CM | POA: Diagnosis not present

## 2020-12-21 LAB — URINALYSIS, ROUTINE W REFLEX MICROSCOPIC
Bilirubin, UA: NEGATIVE
Glucose, UA: NEGATIVE
Ketones, UA: NEGATIVE
Nitrite, UA: NEGATIVE
Protein,UA: NEGATIVE
RBC, UA: NEGATIVE
Specific Gravity, UA: 1.015 (ref 1.005–1.030)
Urobilinogen, Ur: 0.2 mg/dL (ref 0.2–1.0)
pH, UA: 7 (ref 5.0–7.5)

## 2020-12-21 LAB — MICROSCOPIC EXAMINATION
RBC, Urine: NONE SEEN /hpf (ref 0–2)
Renal Epithel, UA: NONE SEEN /hpf

## 2020-12-21 LAB — BLADDER SCAN AMB NON-IMAGING: Scan Result: 192

## 2020-12-21 MED ORDER — TAMSULOSIN HCL 0.4 MG PO CAPS: ORAL_CAPSULE | ORAL | 11 refills | Status: DC

## 2020-12-21 NOTE — Progress Notes (Signed)
Urological Symptom Review  Patient is experiencing the following symptoms: Frequent urination Hard to postpone urination Get up at night to urinate   Review of Systems  Gastrointestinal (upper)  : Negative for upper GI symptoms  Gastrointestinal (lower) : Negative for lower GI symptoms  Constitutional : Negative for symptoms  Skin: Negative for skin symptoms  Eyes: Negative for eye symptoms  Ear/Nose/Throat : Negative for Ear/Nose/Throat symptoms  Hematologic/Lymphatic: Negative for Hematologic/Lymphatic symptoms  Cardiovascular : Negative for cardiovascular symptoms  Respiratory : Negative for respiratory symptoms  Endocrine: Negative for endocrine symptoms  Musculoskeletal: Negative for musculoskeletal symptoms  Neurological: Negative for neurological symptoms  Psychologic: Negative for psychiatric symptoms  

## 2021-01-17 NOTE — Progress Notes (Signed)
History of Present Illness:   3.8.2022:  65 yo male w/ longstanding LUTS--never seen by a Urologist before.  He was started on tamsulosin a few weeks ago.  He does not think this helped his symptoms significantly.  He was treated for recent asymptomatic urinary tract infection. He denies gross hematuria currently, dysuria, or significant incontinence. PSA was checked in December 2020 and was 1.5. Pt placed on dual dose tamsulosin.  4.5.2022: He is here today for follow-up, possible cystoscopy.  He has not had significant improvement on double dose tamsulosin.  IPSS 10, quality-of-life score 3  IPSS Questionnaire (AUA-7): Over the past month.   1)  How often have you had a sensation of not emptying your bladder completely after you finish urinating?  0  2)  How often have you had to urinate again less than two hours after you finished urinating? 0 - Not at all  3)  How often have you found you stopped and started again several times when you urinated?  0  4) How difficult have you found it to postpone urination?  5  5) How often have you had a weak urinary stream?  0  6) How often have you had to push or strain to begin urination?  0  7) How many times did you most typically get up to urinate from the time you went to bed until the time you got up in the morning?  5  Total score:  0-7 mildly symptomatic   8-19 moderately symptomatic   20-35 severely symptomatic     5 West Progression Recent Vital Signs   There were no vitals taken for this visit.   Past Medical History:  Diagnosis Date  . BPH (benign prostatic hyperplasia)   . Hyperlipidemia 10/15/2019  . Hypertension   . Personal history of COVID-19 11/2019     Expected Discharge Date     Diet Order    None       VTE Documentation      Work Intensity Score/Level of Care     @LEVELOFCARE @   Mobility        Significant Events    DC Barriers   Abnormal Labs:  Jorja Loa 01/17/2021, 8:53  PM.2022:   Past Medical History:  Diagnosis Date  . BPH (benign prostatic hyperplasia)   . Hyperlipidemia 10/15/2019  . Hypertension   . Personal history of COVID-19 11/2019    Past Surgical History:  Procedure Laterality Date  . BACK SURGERY  1991   lower thoracic spine fusion to L3 level  . COLONOSCOPY  2015   multiple TA's  . POLYPECTOMY  2015   multiple TA's    Home Medications:  Allergies as of 01/18/2021   No Known Allergies     Medication List       Accurate as of January 17, 2021  8:48 PM. If you have any questions, ask your nurse or doctor.        aspirin 81 MG EC tablet Take 81 mg by mouth daily. Swallow whole.   atorvastatin 10 MG tablet Commonly known as: LIPITOR TAKE 1 TABLET BY MOUTH ONCE DAILY AT  6  PM   losartan 100 MG tablet Commonly known as: COZAAR Take 1 tablet (100 mg total) by mouth daily.   tamsulosin 0.4 MG Caps capsule Commonly known as: FLOMAX Take 1 capsule (0.4 mg total) by mouth daily after supper.   tamsulosin 0.4 MG Caps capsule Commonly known as: FLOMAX Take 1 capsule  twice daily       Allergies: No Known Allergies  Family History  Problem Relation Age of Onset  . Hypertension Mother   . Kidney cancer Mother   . Colon cancer Mother        passed at age 81  . Kidney disease Mother        79 when passed from kidney cancer  . Parkinson's disease Father   . Breast cancer Sister 36  . Aneurysm Maternal Grandmother        brain  . Hypertension Son   . Colon cancer Maternal Uncle 8  . Colon polyps Neg Hx   . Rectal cancer Neg Hx   . Esophageal cancer Neg Hx   . Stomach cancer Neg Hx     Social History:  reports that he quit smoking about 33 years ago. His smoking use included cigarettes. He has never used smokeless tobacco. He reports current alcohol use. He reports that he does not use drugs.  ROS: A complete review of systems was performed.  All systems are negative except for pertinent findings as  noted.  Physical Exam:  Vital signs in last 24 hours: There were no vitals taken for this visit. Constitutional:  Alert and oriented, No acute distress Cardiovascular: Regular rate  Respiratory: Normal respiratory effort GI: Abdomen is soft, nontender, nondistended, no abdominal masses. No CVAT.  Genitourinary: Normal male phallus, testes are descended bilaterally and non-tender and without masses, scrotum is normal in appearance without lesions or masses, perineum is normal on inspection. Lymphatic: No lymphadenopathy Neurologic: Grossly intact, no focal deficits Psychiatric: Normal mood and affect  Cystoscopy Procedure Note:  Indication: Lower urinary tract symptoms, persistent despite maximum dose of tamsulosin  After informed consent and discussion of the procedure and its risks, Nat Lowenthal was positioned and prepped in the standard fashion.  Cystoscopy was performed with a flexible cystoscope.   Findings: Urethra: No stricture Prostate: Nonobstructing, short prostatic urethra Bladder neck: Normal Ureteral orifices: Normal Bladder: Modest residual.  No urothelial abnormalities  The patient tolerated the procedure well.  I have reviewed prior pt notes  I have reviewed notes from referring/previous physicians  I have reviewed urinalysis results  I have reviewed prior PSA results  Complex uroflow-voided volume 160 mL, peak flow 15, average flow 8 mL/s     Impression/Assessment:  Probable overactive bladder  Plan:  1.  I will give him an overactive bladder guide sheet  2.  Back down to 1 tamsulosin a day  3.  I will see back in 2 months for recheck.  If not much better, try pharmacologic management of his overactive bladder

## 2021-01-18 ENCOUNTER — Ambulatory Visit (INDEPENDENT_AMBULATORY_CARE_PROVIDER_SITE_OTHER): Payer: Medicare HMO | Admitting: Urology

## 2021-01-18 ENCOUNTER — Other Ambulatory Visit: Payer: Self-pay

## 2021-01-18 ENCOUNTER — Encounter: Payer: Self-pay | Admitting: Urology

## 2021-01-18 VITALS — BP 146/79 | HR 62 | Temp 98.5°F | Ht 68.0 in | Wt 204.0 lb

## 2021-01-18 DIAGNOSIS — N138 Other obstructive and reflux uropathy: Secondary | ICD-10-CM | POA: Diagnosis not present

## 2021-01-18 DIAGNOSIS — R3915 Urgency of urination: Secondary | ICD-10-CM | POA: Diagnosis not present

## 2021-01-18 DIAGNOSIS — N400 Enlarged prostate without lower urinary tract symptoms: Secondary | ICD-10-CM

## 2021-01-18 DIAGNOSIS — R350 Frequency of micturition: Secondary | ICD-10-CM | POA: Diagnosis not present

## 2021-01-18 DIAGNOSIS — R351 Nocturia: Secondary | ICD-10-CM | POA: Diagnosis not present

## 2021-01-18 DIAGNOSIS — N401 Enlarged prostate with lower urinary tract symptoms: Secondary | ICD-10-CM

## 2021-01-18 LAB — MICROSCOPIC EXAMINATION
RBC, Urine: NONE SEEN /hpf (ref 0–2)
Renal Epithel, UA: NONE SEEN /hpf

## 2021-01-18 LAB — URINALYSIS, ROUTINE W REFLEX MICROSCOPIC
Bilirubin, UA: NEGATIVE
Glucose, UA: NEGATIVE
Ketones, UA: NEGATIVE
Nitrite, UA: NEGATIVE
Protein,UA: NEGATIVE
RBC, UA: NEGATIVE
Specific Gravity, UA: 1.025 (ref 1.005–1.030)
Urobilinogen, Ur: 0.2 mg/dL (ref 0.2–1.0)
pH, UA: 6 (ref 5.0–7.5)

## 2021-01-18 MED ORDER — CIPROFLOXACIN HCL 500 MG PO TABS
500.0000 mg | ORAL_TABLET | Freq: Once | ORAL | Status: AC
  Administered 2021-01-18: 500 mg via ORAL

## 2021-01-18 NOTE — Progress Notes (Signed)
Uroflow  Peak Flow: 57ml Average Flow: 110ml Voided Volume: 164ml Voiding Time: 20sec Flow Time: 20sec Time to Peak Flow: 6sec    Urological Symptom Review  Patient is experiencing the following symptoms: Frequent urination Hard to postpone urination Get up at night to urinate   Review of Systems  Gastrointestinal (upper)  : Negative for upper GI symptoms  Gastrointestinal (lower) : Negative for lower GI symptoms  Constitutional : Negative for symptoms  Skin: Negative for skin symptoms  Eyes: Negative for eye symptoms  Ear/Nose/Throat : Negative for Ear/Nose/Throat symptoms  Hematologic/Lymphatic: Negative for Hematologic/Lymphatic symptoms  Cardiovascular : Negative for cardiovascular symptoms  Respiratory : Negative for respiratory symptoms  Endocrine: Negative for endocrine symptoms  Musculoskeletal: Negative for musculoskeletal symptoms  Neurological: Negative for neurological symptoms  Psychologic: Negative for psychiatric symptoms

## 2021-01-19 ENCOUNTER — Ambulatory Visit (INDEPENDENT_AMBULATORY_CARE_PROVIDER_SITE_OTHER): Payer: Medicare HMO

## 2021-01-19 VITALS — Ht 68.0 in | Wt 204.0 lb

## 2021-01-19 DIAGNOSIS — Z0000 Encounter for general adult medical examination without abnormal findings: Secondary | ICD-10-CM | POA: Diagnosis not present

## 2021-01-19 NOTE — Patient Instructions (Signed)
Mr. Dakota Cooke , Thank you for taking time to come for your Medicare Wellness Visit. I appreciate your ongoing commitment to your health goals. Please review the following plan we discussed and let me know if I can assist you in the future.   Screening recommendations/referrals: Colonoscopy: Done 06/03/20 - Repeat in 3 years Recommended yearly ophthalmology/optometry visit for glaucoma screening and checkup Recommended yearly dental visit for hygiene and checkup  Vaccinations: Influenza vaccine: Declined Pneumococcal vaccine: Due Tdap vaccine: Done 01/23/2013 Shingles vaccine: Shingrix discussed. Please contact your pharmacy for coverage information.    Covid-19: Complete: 10/21/19, 04/14/20, & 03/17/20  Advanced directives: Please bring a copy of your health care power of attorney and living will to the office to be added to your chart at your convenience.  Conditions/risks identified: Try to get 30 minutes of exercise every day and eat plenty of fruits and vegetables.  Next appointment: Follow up in one year for your annual wellness visit.   Preventive Care 67 Years and Older, Male  Preventive care refers to lifestyle choices and visits with your health care provider that can promote health and wellness. What does preventive care include?  A yearly physical exam. This is also called an annual well check.  Dental exams once or twice a year.  Routine eye exams. Ask your health care provider how often you should have your eyes checked.  Personal lifestyle choices, including:  Daily care of your teeth and gums.  Regular physical activity.  Eating a healthy diet.  Avoiding tobacco and drug use.  Limiting alcohol use.  Practicing safe sex.  Taking low doses of aspirin every day.  Taking vitamin and mineral supplements as recommended by your health care provider. What happens during an annual well check? The services and screenings done by your health care provider during your  annual well check will depend on your age, overall health, lifestyle risk factors, and family history of disease. Counseling  Your health care provider may ask you questions about your:  Alcohol use.  Tobacco use.  Drug use.  Emotional well-being.  Home and relationship well-being.  Sexual activity.  Eating habits.  History of falls.  Memory and ability to understand (cognition).  Work and work Statistician. Screening  You may have the following tests or measurements:  Height, weight, and BMI.  Blood pressure.  Lipid and cholesterol levels. These may be checked every 5 years, or more frequently if you are over 67 years old.  Skin check.  Lung cancer screening. You may have this screening every year starting at age 67 if you have a 30-pack-year history of smoking and currently smoke or have quit within the past 15 years.  Fecal occult blood test (FOBT) of the stool. You may have this test every year starting at age 67.  Flexible sigmoidoscopy or colonoscopy. You may have a sigmoidoscopy every 5 years or a colonoscopy every 10 years starting at age 67.  Prostate cancer screening. Recommendations will vary depending on your family history and other risks.  Hepatitis C blood test.  Hepatitis B blood test.  Sexually transmitted disease (STD) testing.  Diabetes screening. This is done by checking your blood sugar (glucose) after you have not eaten for a while (fasting). You may have this done every 1-3 years.  Abdominal aortic aneurysm (AAA) screening. You may need this if you are a current or former smoker.  Osteoporosis. You may be screened starting at age 67 if you are at high risk. Talk with your  health care provider about your test results, treatment options, and if necessary, the need for more tests. Vaccines  Your health care provider may recommend certain vaccines, such as:  Influenza vaccine. This is recommended every year.  Tetanus, diphtheria, and  acellular pertussis (Tdap, Td) vaccine. You may need a Td booster every 10 years.  Zoster vaccine. You may need this after age 67.  Pneumococcal 13-valent conjugate (PCV13) vaccine. One dose is recommended after age 67.  Pneumococcal polysaccharide (PPSV23) vaccine. One dose is recommended after age 67. Talk to your health care provider about which screenings and vaccines you need and how often you need them. This information is not intended to replace advice given to you by your health care provider. Make sure you discuss any questions you have with your health care provider. Document Released: 10/29/2015 Document Revised: 06/21/2016 Document Reviewed: 08/03/2015 Elsevier Interactive Patient Education  2017 [employer] Prevention in the Home Falls can cause injuries. They can happen to people of all ages. There are many things you can do to make your home safe and to help prevent falls. What can I do on the outside of my home?  Regularly fix the edges of walkways and driveways and fix any cracks.  Remove anything that might make you trip as you walk through a door, such as a raised step or threshold.  Trim any bushes or trees on the path to your home.  Use bright outdoor lighting.  Clear any walking paths of anything that might make someone trip, such as rocks or tools.  Regularly check to see if handrails are loose or broken. Make sure that both sides of any steps have handrails.  Any raised decks and porches should have guardrails on the edges.  Have any leaves, snow, or ice cleared regularly.  Use sand or salt on walking paths during winter.  Clean up any spills in your garage right away. This includes oil or grease spills. What can I do in the bathroom?  Use night lights.  Install grab bars by the toilet and in the tub and shower. Do not use towel bars as grab bars.  Use non-skid mats or decals in the tub or shower.  If you need to sit down in the shower, use  a plastic, non-slip stool.  Keep the floor dry. Clean up any water that spills on the floor as soon as it happens.  Remove soap buildup in the tub or shower regularly.  Attach bath mats securely with double-sided non-slip rug tape.  Do not have throw rugs and other things on the floor that can make you trip. What can I do in the bedroom?  Use night lights.  Make sure that you have a light by your bed that is easy to reach.  Do not use any sheets or blankets that are too big for your bed. They should not hang down onto the floor.  Have a firm chair that has side arms. You can use this for support while you get dressed.  Do not have throw rugs and other things on the floor that can make you trip. What can I do in the kitchen?  Clean up any spills right away.  Avoid walking on wet floors.  Keep items that you use a lot in easy-to-reach places.  If you need to reach something above you, use a strong step stool that has a grab bar.  Keep electrical cords out of the way.  Do not use  floor polish or wax that makes floors slippery. If you must use wax, use non-skid floor wax.  Do not have throw rugs and other things on the floor that can make you trip. What can I do with my stairs?  Do not leave any items on the stairs.  Make sure that there are handrails on both sides of the stairs and use them. Fix handrails that are broken or loose. Make sure that handrails are as long as the stairways.  Check any carpeting to make sure that it is firmly attached to the stairs. Fix any carpet that is loose or worn.  Avoid having throw rugs at the top or bottom of the stairs. If you do have throw rugs, attach them to the floor with carpet tape.  Make sure that you have a light switch at the top of the stairs and the bottom of the stairs. If you do not have them, ask someone to add them for you. What else can I do to help prevent falls?  Wear shoes that:  Do not have high heels.  Have  rubber bottoms.  Are comfortable and fit you well.  Are closed at the toe. Do not wear sandals.  If you use a stepladder:  Make sure that it is fully opened. Do not climb a closed stepladder.  Make sure that both sides of the stepladder are locked into place.  Ask someone to hold it for you, if possible.  Clearly mark and make sure that you can see:  Any grab bars or handrails.  First and last steps.  Where the edge of each step is.  Use tools that help you move around (mobility aids) if they are needed. These include:  Canes.  Walkers.  Scooters.  Crutches.  Turn on the lights when you go into a dark area. Replace any light bulbs as soon as they burn out.  Set up your furniture so you have a clear path. Avoid moving your furniture around.  If any of your floors are uneven, fix them.  If there are any pets around you, be aware of where they are.  Review your medicines with your doctor. Some medicines can make you feel dizzy. This can increase your chance of falling. Ask your doctor what other things that you can do to help prevent falls. This information is not intended to replace advice given to you by your health care provider. Make sure you discuss any questions you have with your health care provider. Document Released: 07/29/2009 Document Revised: 03/09/2016 Document Reviewed: 11/06/2014 Elsevier Interactive Patient Education  2017 [employer].

## 2021-01-19 NOTE — Progress Notes (Signed)
Subjective:   Dakota Cooke is a 65 y.o. male who presents for an Initial Medicare Annual Wellness Visit.  Virtual Visit via Telephone Note  I connected with  Dakota Cooke on 01/19/21 at  8:15 AM EDT by telephone and verified that I am speaking with the correct person using two identifiers.  Location: Patient: Home Provider: [facility] Persons participating in the virtual visit: patient/Nurse Health Advisor   I discussed the limitations, risks, security and privacy concerns of performing an evaluation and management service by telephone and the availability of in person appointments. The patient expressed understanding and agreed to proceed.  Interactive audio and video telecommunications were attempted between this nurse and patient, however failed, due to patient having technical difficulties OR patient did not have access to video capability.  We continued and completed visit with audio only.  Some vital signs may be absent or patient reported.   Nat Lowenthal E Briannia Laba, LPN   Review of Systems     Cardiac Risk Factors include: advanced age (>43men, >30 women);male gender;hypertension;dyslipidemia;obesity (BMI >30kg/m2)     Objective:    Today's Vitals   01/19/21 0822  Weight: 204 lb (92.5 kg)  Height: 5\' 8"  (1.727 m)   Body mass index is 31.02 kg/m.  Advanced Directives 01/19/2021 11/29/2019 11/29/2019  Does Patient Have a Medical Advance Directive? No No No  Would patient like information on creating a medical advance directive? No - Patient declined No - Patient declined No - Patient declined    Current Medications (verified) Outpatient Encounter Medications as of 01/19/2021  Medication Sig  . aspirin 81 MG EC tablet Take 81 mg by mouth daily. Swallow whole.  Marland Kitchen atorvastatin (LIPITOR) 10 MG tablet TAKE 1 TABLET BY MOUTH ONCE DAILY AT  6  PM  . losartan (COZAAR) 100 MG tablet Take 1 tablet (100 mg total) by mouth daily.  . tamsulosin (FLOMAX) 0.4 MG CAPS capsule Take 1 capsule twice  daily   No facility-administered encounter medications on file as of 01/19/2021.    Allergies (verified) Patient has no known allergies.   History: Past Medical History:  Diagnosis Date  . BPH (benign prostatic hyperplasia)   . Hyperlipidemia 10/15/2019  . Hypertension   . Personal history of COVID-19 11/2019   Past Surgical History:  Procedure Laterality Date  . BACK SURGERY  1991   lower thoracic spine fusion to L3 level  . COLONOSCOPY  2015   multiple TA's  . POLYPECTOMY  2015   multiple TA's   Family History  Problem Relation Age of Onset  . Hypertension Mother   . Kidney cancer Mother   . Colon cancer Mother        passed at age 41  . Kidney disease Mother        35 when passed from kidney cancer  . Parkinson's disease Father   . Breast cancer Sister 50  . Aneurysm Maternal Grandmother        brain  . Hypertension Son   . Colon cancer Maternal Uncle 21  . Colon polyps Neg Hx   . Rectal cancer Neg Hx   . Esophageal cancer Neg Hx   . Stomach cancer Neg Hx    Social History   Socioeconomic History  . Marital status: Married    Spouse name: Not on file  . Number of children: 3  . Years of education: Not on file  . Highest education level: Not on file  Occupational History  . Occupation: retired  Tobacco  Use  . Smoking status: Former Smoker    Types: Cigarettes    Quit date: 1989    Years since quitting: 33.2  . Smokeless tobacco: Never Used  Vaping Use  . Vaping Use: Never used  Substance and Sexual Activity  . Alcohol use: Yes    Alcohol/week: 1.0 - 2.0 standard drink    Types: 1 - 2 Cans of beer per week    Comment: occassionally  . Drug use: Never  . Sexual activity: Not on file  Other Topics Concern  . Not on file  Social History Narrative  . Not on file   Social Determinants of Health   Financial Resource Strain: Low Risk   . Difficulty of Paying Living Expenses: Not hard at all  Food Insecurity: No Food Insecurity  . Worried About  Charity fundraiser in the Last Year: Never true  . Ran Out of Food in the Last Year: Never true  Transportation Needs: No Transportation Needs  . Lack of Transportation (Medical): No  . Lack of Transportation (Non-Medical): No  Physical Activity: Insufficiently Active  . Days of Exercise per Week: 3 days  . Minutes of Exercise per Session: 30 min  Stress: No Stress Concern Present  . Feeling of Stress : Not at all  Social Connections: Moderately Isolated  . Frequency of Communication with Friends and Family: More than three times a week  . Frequency of Social Gatherings with Friends and Family: Once a week  . Attends Religious Services: Never  . Active Member of Clubs or Organizations: No  . Attends Archivist Meetings: Never  . Marital Status: Married    Tobacco Counseling Counseling given: Not Answered   Clinical Intake:  Pre-visit preparation completed: Yes  Pain : No/denies pain     BMI - recorded: 31.02 Nutritional Status: BMI > 30  Obese Nutritional Risks: None Diabetes: No  How often do you need to have someone help you when you read instructions, pamphlets, or other written materials from your doctor or pharmacy?: 1 - Never  Diabetic? no  Interpreter Needed?: No  Information entered by :: Anevay Campanella, LPN   Activities of Daily Living In your present state of health, do you have any difficulty performing the following activities: 01/19/2021  Hearing? Y  Comment mild hearing loss  Vision? N  Difficulty concentrating or making decisions? N  Walking or climbing stairs? N  Dressing or bathing? N  Doing errands, shopping? N  Preparing Food and eating ? N  Using the Toilet? N  In the past six months, have you accidently leaked urine? N  Do you have problems with loss of bowel control? N  Managing your Medications? N  Managing your Finances? N  Housekeeping or managing your Housekeeping? N  Some recent data might be hidden    Patient Care  Team: Loman Brooklyn, FNP as PCP - General (Family Medicine) Armbruster, Carlota Raspberry, MD as Consulting Physician (Gastroenterology) Franchot Gallo, MD as Consulting Physician (Urology)  Indicate any recent Medical Services you may have received from other than [facility] providers in the past year (date may be approximate).     Assessment:   This is a routine wellness examination for Dakota Cooke.  Hearing/Vision screen  Hearing Screening   125Hz  250Hz  500Hz  1000Hz  2000Hz  3000Hz  4000Hz  6000Hz  8000Hz   Right ear:           Left ear:           Comments: C/o mild hearing  loss - declines hearing aids  Vision Screening Comments: Wears glasses - Doesn't have an eye doctor at this time. Referral declined - will let us know if he changes his mind.  Dietary issues and exercise activities discussed: Current Exercise Habits: Home exercise routine, Type of exercise: walking, Time (Minutes): 30, Frequency (Times/Week): 3, Weekly Exercise (Minutes/Week): 90, Intensity: Mild, Exercise limited by: None identified  Goals    . Patient Stated     Stay active and healthy      Depression Screen PHQ 2/9 Scores 01/19/2021 12/16/2020 11/04/2020 03/16/2020 10/14/2019  PHQ - 2 Score 0 0 0 0 0    Fall Risk Fall Risk  01/19/2021 12/16/2020 11/04/2020 03/16/2020 10/14/2019  Falls in the past year? 0 0 0 0 1  Number falls in past yr: 0 - - - 0  Injury with Fall? 0 - - - 0  Risk for fall due to : No Fall Risks - - - -  Follow up Falls prevention discussed - - - -    FALL RISK PREVENTION PERTAINING TO THE HOME:  Any stairs in or around the home? No  If so, are there any without handrails? No  Home free of loose throw rugs in walkways, pet beds, electrical cords, etc? Yes  Adequate lighting in your home to reduce risk of falls? Yes   ASSISTIVE DEVICES UTILIZED TO PREVENT FALLS:  Life alert? No  Use of a cane, walker or w/c? No  Grab bars in the bathroom? Yes  Shower chair or bench in shower? Yes  Elevated toilet seat  or a handicapped toilet? Yes   TIMED UP AND GO:  Was the test performed? No . Telephonic visit.  Cognitive Function: Normal cognitive status assessed by direct observation by this Nurse Health Advisor. No abnormalities found.         Immunizations Immunization History  Administered Date(s) Administered  . Moderna Sars-Covid-2 Vaccination 03/17/2020, 04/14/2020, 10/20/2020  . Tdap 10/14/2011, 01/23/2013    TDAP status: Up to date  Flu Vaccine status: Declined, Education has been provided regarding the importance of this vaccine but patient still declined. Advised may receive this vaccine at local pharmacy or Health Dept. Aware to provide a copy of the vaccination record if obtained from local pharmacy or Health Dept. Verbalized acceptance and understanding.  Pneumococcal vaccine status: Due, Education has been provided regarding the importance of this vaccine. Advised may receive this vaccine at local pharmacy or Health Dept. Aware to provide a copy of the vaccination record if obtained from local pharmacy or Health Dept. Verbalized acceptance and understanding.  Covid-19 vaccine status: Completed vaccines  Qualifies for Shingles Vaccine? Yes   Zostavax completed No   Shingrix Completed?: No.    Education has been provided regarding the importance of this vaccine. Patient has been advised to call insurance company to determine out of pocket expense if they have not yet received this vaccine. Advised may also receive vaccine at local pharmacy or Health Dept. Verbalized acceptance and understanding.  Screening Tests Health Maintenance  Topic Date Due  . PNA vac Low Risk Adult (1 of 2 - PCV13) 11/04/2021 (Originally 03/14/2019)  . INFLUENZA VACCINE  05/16/2021  . TETANUS/TDAP  01/24/2023  . COLONOSCOPY (Pts 45-79yrs Insurance coverage will need to be confirmed)  06/04/2023  . COVID-19 Vaccine  Completed  . Hepatitis C Screening  Completed  . HPV VACCINES  Aged Out    Health  Maintenance  There are no preventive care reminders to display for  this patient.  Colorectal cancer screening: Type of screening: Colonoscopy. Completed 06/03/2020. Repeat every 3 years  Lung Cancer Screening: (Low Dose CT Chest recommended if Age 18-80 years, 30 pack-year currently smoking OR have quit w/in 15years.) does not qualify.   Additional Screening:  Hepatitis C Screening: does qualify; Completed 01/09/2020   Vision Screening: Recommended annual ophthalmology exams for early detection of glaucoma and other disorders of the eye. Is the patient up to date with their annual eye exam?  No  Who is the provider or what is the name of the office in which the patient attends annual eye exams? No eye doctor at this time If pt is not established with a provider, would they like to be referred to a provider to establish care? No .   Dental Screening: Recommended annual dental exams for proper oral hygiene  Community Resource Referral / Chronic Care Management: CRR required this visit?  No   CCM required this visit?  No      Plan:     I have personally reviewed and noted the following in the patient's chart:   . Medical and social history . Use of alcohol, tobacco or illicit drugs  . Current medications and supplements . Functional ability and status . Nutritional status . Physical activity . Advanced directives . List of other physicians . Hospitalizations, surgeries, and ER visits in previous 12 months . Vitals . Screenings to include cognitive, depression, and falls . Referrals and appointments  In addition, I have reviewed and discussed with patient certain preventive protocols, quality metrics, and best practice recommendations. A written personalized care plan for preventive services as well as general preventive health recommendations were provided to patient.     Sandrea Hammond, LPN   03/16/9508   Nurse Notes: None

## 2021-03-01 ENCOUNTER — Other Ambulatory Visit: Payer: Self-pay | Admitting: Family Medicine

## 2021-03-01 DIAGNOSIS — I10 Essential (primary) hypertension: Secondary | ICD-10-CM

## 2021-03-02 ENCOUNTER — Other Ambulatory Visit: Payer: Self-pay | Admitting: Family Medicine

## 2021-03-02 DIAGNOSIS — I10 Essential (primary) hypertension: Secondary | ICD-10-CM

## 2021-04-12 ENCOUNTER — Other Ambulatory Visit: Payer: Self-pay

## 2021-04-12 ENCOUNTER — Telehealth (INDEPENDENT_AMBULATORY_CARE_PROVIDER_SITE_OTHER): Payer: Medicare HMO | Admitting: Urology

## 2021-04-12 ENCOUNTER — Encounter: Payer: Self-pay | Admitting: Urology

## 2021-04-12 DIAGNOSIS — R350 Frequency of micturition: Secondary | ICD-10-CM | POA: Diagnosis not present

## 2021-04-12 DIAGNOSIS — R351 Nocturia: Secondary | ICD-10-CM | POA: Diagnosis not present

## 2021-04-12 DIAGNOSIS — N400 Enlarged prostate without lower urinary tract symptoms: Secondary | ICD-10-CM | POA: Diagnosis not present

## 2021-04-12 DIAGNOSIS — R3915 Urgency of urination: Secondary | ICD-10-CM

## 2021-04-12 NOTE — Progress Notes (Addendum)
History of Present Illness: Telehealth visit today to follow-up voiding symptoms and prior therapy.  3.8.2022:  65 yo male w/ longstanding LUTS--never seen by a Urologist before.  He was started on tamsulosin a few weeks ago.  He does not think this helped his symptoms significantly.  He was treated for recent asymptomatic urinary tract infection. He denies gross hematuria currently, dysuria, or significant incontinence. PSA was checked in December 2020 and was 1.5. Pt placed on dual dose tamsulosin.   4.5.2022: He underwent cystoscopy and flow rate.  Cystoscopy revealed no evident obstruction of the prostate.  Peak flow rate 15, average flow rate 8 mL a second.  He was taken off tamsulosin.  I gave him an overactive bladder guide sheet.   IPSS 10, quality-of-life score 3  6.28.2022: He is finished up his tamsulosin.  He has paid attention to his fluid intake and follow the guidelines on the overactive bladder sheet.  He is overall happy with the success of his symptomatic improvement.  Past Medical History:  Diagnosis Date   BPH (benign prostatic hyperplasia)    Hyperlipidemia 10/15/2019   Hypertension    Personal history of COVID-19 11/2019    Past Surgical History:  Procedure Laterality Date   BACK SURGERY  1991   lower thoracic spine fusion to L3 level   COLONOSCOPY  2015   multiple TA's   POLYPECTOMY  2015   multiple TA's    Home Medications:  Allergies as of 04/12/2021   No Known Allergies      Medication List        Accurate as of April 12, 2021 11:57 AM. If you have any questions, ask your nurse or doctor.          aspirin 81 MG EC tablet Take 81 mg by mouth daily. Swallow whole.   atorvastatin 10 MG tablet Commonly known as: LIPITOR TAKE 1 TABLET BY MOUTH ONCE DAILY AT  6  PM   losartan 100 MG tablet Commonly known as: COZAAR Take 1 tablet by mouth once daily   tamsulosin 0.4 MG Caps capsule Commonly known as: FLOMAX Take 1 capsule twice daily         Allergies: No Known Allergies  Family History  Problem Relation Age of Onset   Hypertension Mother    Kidney cancer Mother    Colon cancer Mother        passed at age 40   Kidney disease Mother        41 when passed from kidney cancer   Parkinson's disease Father    Breast cancer Sister 84   Aneurysm Maternal Grandmother        brain   Hypertension Son    Colon cancer Maternal Uncle 84   Colon polyps Neg Hx    Rectal cancer Neg Hx    Esophageal cancer Neg Hx    Stomach cancer Neg Hx     Social History:  reports that he quit smoking about 33 years ago. His smoking use included cigarettes. He has never used smokeless tobacco. He reports current alcohol use of about 1.0 - 2.0 standard drink of alcohol per week. He reports that he does not use drugs.  ROS: A complete review of systems was performed.  All systems are negative except for pertinent findings as noted.   Impression/Assessment:  BPH, with associated symptoms  Overactive bladder, I think his main issue, improved with behavioral change  Plan:  1.  He will continue on the overactive  bladder guidelines  2.  Stay off tamsulosin  3.  I will see back in 1 year for recheck   I connected with  Shirlean Schlein on 04/12/21 by telephone and verified that I am speaking with the correct person using two identifiers.   I discussed the limitations of evaluation and management by telemedicine. The patient expressed understanding and agreed to proceed.  Pt seen in context of Covid 19 pandemic  Pt location: Home My location: Home office Persons on call: Patient, myself  Time spent: 15 minutes

## 2021-05-10 NOTE — Addendum Note (Signed)
Addended by: Franchot Gallo on: 05/10/2021 12:17 PM   Modules accepted: Level of Service

## 2021-05-24 ENCOUNTER — Other Ambulatory Visit: Payer: Self-pay | Admitting: Family Medicine

## 2021-05-24 DIAGNOSIS — E782 Mixed hyperlipidemia: Secondary | ICD-10-CM

## 2021-05-24 DIAGNOSIS — I10 Essential (primary) hypertension: Secondary | ICD-10-CM

## 2021-06-17 ENCOUNTER — Other Ambulatory Visit: Payer: Self-pay

## 2021-06-17 ENCOUNTER — Encounter: Payer: Self-pay | Admitting: Family Medicine

## 2021-06-17 ENCOUNTER — Ambulatory Visit (INDEPENDENT_AMBULATORY_CARE_PROVIDER_SITE_OTHER): Payer: Medicare HMO | Admitting: Family Medicine

## 2021-06-17 VITALS — BP 147/86 | HR 61 | Temp 97.8°F | Ht 68.0 in | Wt 210.6 lb

## 2021-06-17 DIAGNOSIS — J302 Other seasonal allergic rhinitis: Secondary | ICD-10-CM

## 2021-06-17 DIAGNOSIS — L918 Other hypertrophic disorders of the skin: Secondary | ICD-10-CM | POA: Diagnosis not present

## 2021-06-17 DIAGNOSIS — I10 Essential (primary) hypertension: Secondary | ICD-10-CM

## 2021-06-17 DIAGNOSIS — E782 Mixed hyperlipidemia: Secondary | ICD-10-CM | POA: Diagnosis not present

## 2021-06-17 DIAGNOSIS — L989 Disorder of the skin and subcutaneous tissue, unspecified: Secondary | ICD-10-CM | POA: Diagnosis not present

## 2021-06-17 DIAGNOSIS — N3281 Overactive bladder: Secondary | ICD-10-CM | POA: Diagnosis not present

## 2021-06-17 NOTE — Progress Notes (Signed)
Assessment & Plan:  1. Essential hypertension Well controlled on current regimen.  - CBC with Differential/Platelet - CMP14+EGFR - Lipid panel  2. Mixed hyperlipidemia Well controlled on current regimen.  - CMP14+EGFR - Lipid panel  3. Overactive bladder Declines medication. Will discuss with urology if he changes his mind.  4. Skin lesion of back - Ambulatory referral to Dermatology  5. Skin tag - Ambulatory referral to Dermatology  6. Seasonal allergies Recommended Flonase and antihistamine.    Return in about 6 months (around 12/15/2021) for annual physical.  Hendricks Limes, MSN, APRN, FNP-C [facility]  Subjective:    Patient ID: Dakota Cooke, male    DOB: 04/21/1956, 66 y.o.   MRN: [id number]  Patient Care Team: Loman Brooklyn, FNP as PCP - General (Family Medicine) Armbruster, Carlota Raspberry, MD as Consulting Physician (Gastroenterology) Franchot Gallo, MD as Consulting Physician (Urology)   Chief Complaint:  Chief Complaint  Patient presents with   Hypertension   Hyperlipidemia    Check up of chronic medical conditions     HPI: Dakota Cooke is a 65 y.o. male presenting on 06/17/2021 for Hypertension and Hyperlipidemia (Check up of chronic medical conditions )  Hypertension: Patient has been spot checking his blood pressure at home every 2-3 weeks and reports it is normally 141-143/81-83. He does eat a low salt diet. No structured exercise, but he does stay active.  Overactive bladder: patient has seen urology. Flomax was discontinued and he was told his prostate is fine, but that he has an overactive bladder. He was offered medication, but declined. He has been trying to focus on holding his urine longer. States the amount of times he gets up at night to urinate has decreased to twice during the night.   New complaints: Patient reports a place on his back that itches and bleeds.   He is also asking what he can take for allergies  (if he decides to take something, because he doesn't like taking medications).   Social history:  Relevant past medical, surgical, family and social history reviewed and updated as indicated. Interim medical history since our last visit reviewed.  Allergies and medications reviewed and updated.  DATA REVIEWED: CHART IN EPIC  ROS: Negative unless specifically indicated above in HPI.    Current Outpatient Medications:    aspirin 81 MG EC tablet, Take 81 mg by mouth daily. Swallow whole., Disp: , Rfl:    atorvastatin (LIPITOR) 10 MG tablet, TAKE 1 TABLET BY MOUTH ONCE DAILY AT 6 PM, Disp: 90 tablet, Rfl: 0   losartan (COZAAR) 100 MG tablet, Take 1 tablet by mouth once daily, Disp: 90 tablet, Rfl: 0   No Known Allergies Past Medical History:  Diagnosis Date   BPH (benign prostatic hyperplasia)    Hyperlipidemia 10/15/2019   Hypertension    Personal history of COVID-19 11/2019    Past Surgical History:  Procedure Laterality Date   BACK SURGERY  1991   lower thoracic spine fusion to L3 level   COLONOSCOPY  2015   multiple TA's   POLYPECTOMY  2015   multiple TA's    Social History   Socioeconomic History   Marital status: Married    Spouse name: Not on file   Number of children: 3   Years of education: Not on file   Highest education level: Not on file  Occupational History   Occupation: retired  Tobacco Use   Smoking status: Former    Types: Cigarettes  Quit date: 64    Years since quitting: 33.6   Smokeless tobacco: Never  Vaping Use   Vaping Use: Never used  Substance and Sexual Activity   Alcohol use: Yes    Alcohol/week: 1.0 - 2.0 standard drink    Types: 1 - 2 Cans of beer per week    Comment: occassionally   Drug use: Never   Sexual activity: Not on file  Other Topics Concern   Not on file  Social History Narrative   Not on file   Social Determinants of Health   Financial Resource Strain: Low Risk    Difficulty of Paying Living Expenses: Not  hard at all  Food Insecurity: No Food Insecurity   Worried About Charity fundraiser in the Last Year: Never true   [place] in the Last Year: Never true  Transportation Needs: No Transportation Needs   Lack of Transportation (Medical): No   Lack of Transportation (Non-Medical): No  Physical Activity: Insufficiently Active   Days of Exercise per Week: 3 days   Minutes of Exercise per Session: 30 min  Stress: No Stress Concern Present   Feeling of Stress : Not at all  Social Connections: Moderately Isolated   Frequency of Communication with Friends and Family: More than three times a week   Frequency of Social Gatherings with Friends and Family: Once a week   Attends Religious Services: Never   Marine scientist or Organizations: No   Attends Music therapist: Never   Marital Status: Married  Human resources officer Violence: Not At Risk   Fear of Current or Ex-Partner: No   Emotionally Abused: No   Physically Abused: No   Sexually Abused: No        Objective:    BP (!) 147/86   Pulse 61   Temp 97.8 F (36.6 C) (Temporal)   Ht _0  (1.727 m)   Wt 210 lb 9.6 oz (95.5 kg)   SpO2 98%   BMI 32.02 kg/m   Wt Readings from Last 3 Encounters:  06/17/21 210 lb 9.6 oz (95.5 kg)  01/19/21 204 lb (92.5 kg)  01/18/21 204 lb (92.5 kg)    Physical Exam Vitals reviewed.  Constitutional:      General: He is not in acute distress.    Appearance: Normal appearance. He is obese. He is not ill-appearing, toxic-appearing or diaphoretic.  HENT:     Head: Normocephalic and atraumatic.  Eyes:     General: No scleral icterus.       Right eye: No discharge.        Left eye: No discharge.     Conjunctiva/sclera: Conjunctivae normal.  Cardiovascular:     Rate and Rhythm: Normal rate and regular rhythm.     Heart sounds: Normal heart sounds. No murmur heard.   No friction rub. No gallop.  Pulmonary:     Effort: Pulmonary effort is normal. No respiratory distress.      Breath sounds: Normal breath sounds. No stridor. No wheezing, rhonchi or rales.  Musculoskeletal:        General: Normal range of motion.     Cervical back: Normal range of motion.  Skin:    General: Skin is warm and dry.     Findings: Lesion (left upper back - irregular shape) present.     Comments: Numerous skin tags in both axilla.  Neurological:     Mental Status: He is alert and oriented to person, place,  and time. Mental status is at baseline.  Psychiatric:        Mood and Affect: Mood normal.        Behavior: Behavior normal.        Thought Content: Thought content normal.        Judgment: Judgment normal.    Lab Results  Component Value Date   TSH 2.310 12/29/2019   Lab Results  Component Value Date   WBC 6.9 11/08/2020   HGB 15.4 11/08/2020   HCT 45.9 11/08/2020   MCV 87 11/08/2020   PLT 184 11/08/2020   Lab Results  Component Value Date   NA 142 11/08/2020   K 4.4 11/08/2020   CO2 24 11/08/2020   GLUCOSE 101 (H) 11/08/2020   BUN 12 11/08/2020   CREATININE 0.93 11/08/2020   BILITOT 0.8 11/08/2020   ALKPHOS 65 11/08/2020   AST 22 11/08/2020   ALT 23 11/08/2020   PROT 6.9 11/08/2020   ALBUMIN 4.6 11/08/2020   CALCIUM 9.2 11/08/2020   ANIONGAP 11 12/08/2019   Lab Results  Component Value Date   CHOL 142 11/08/2020   Lab Results  Component Value Date   HDL 32 (L) 11/08/2020   Lab Results  Component Value Date   LDLCALC 80 11/08/2020   Lab Results  Component Value Date   TRIG 173 (H) 11/08/2020   Lab Results  Component Value Date   CHOLHDL 4.4 11/08/2020   Lab Results  Component Value Date   HGBA1C 5.7 10/14/2019

## 2021-06-18 LAB — CBC WITH DIFFERENTIAL/PLATELET
Basophils Absolute: 0 10*3/uL (ref 0.0–0.2)
Basos: 1 %
EOS (ABSOLUTE): 0.2 10*3/uL (ref 0.0–0.4)
Eos: 3 %
Hematocrit: 49.2 % (ref 37.5–51.0)
Hemoglobin: 16.1 g/dL (ref 13.0–17.7)
Immature Grans (Abs): 0 10*3/uL (ref 0.0–0.1)
Immature Granulocytes: 0 %
Lymphocytes Absolute: 2.5 10*3/uL (ref 0.7–3.1)
Lymphs: 37 %
MCH: 29.7 pg (ref 26.6–33.0)
MCHC: 32.7 g/dL (ref 31.5–35.7)
MCV: 91 fL (ref 79–97)
Monocytes Absolute: 0.8 10*3/uL (ref 0.1–0.9)
Monocytes: 11 %
Neutrophils Absolute: 3.2 10*3/uL (ref 1.4–7.0)
Neutrophils: 48 %
Platelets: 165 10*3/uL (ref 150–450)
RBC: 5.42 x10E6/uL (ref 4.14–5.80)
RDW: 13 % (ref 11.6–15.4)
WBC: 6.7 10*3/uL (ref 3.4–10.8)

## 2021-06-18 LAB — CMP14+EGFR
ALT: 28 IU/L (ref 0–44)
AST: 31 IU/L (ref 0–40)
Albumin/Globulin Ratio: 2.6 — ABNORMAL HIGH (ref 1.2–2.2)
Albumin: 4.9 g/dL — ABNORMAL HIGH (ref 3.8–4.8)
Alkaline Phosphatase: 65 IU/L (ref 44–121)
BUN/Creatinine Ratio: 15 (ref 10–24)
BUN: 15 mg/dL (ref 8–27)
Bilirubin Total: 0.7 mg/dL (ref 0.0–1.2)
CO2: 23 mmol/L (ref 20–29)
Calcium: 9.8 mg/dL (ref 8.6–10.2)
Chloride: 104 mmol/L (ref 96–106)
Creatinine, Ser: 1.03 mg/dL (ref 0.76–1.27)
Globulin, Total: 1.9 g/dL (ref 1.5–4.5)
Glucose: 100 mg/dL — ABNORMAL HIGH (ref 65–99)
Potassium: 4.6 mmol/L (ref 3.5–5.2)
Sodium: 142 mmol/L (ref 134–144)
Total Protein: 6.8 g/dL (ref 6.0–8.5)
eGFR: 80 mL/min/{1.73_m2} (ref >59)

## 2021-06-18 LAB — LIPID PANEL
Chol/HDL Ratio: 3.8 ratio (ref 0.0–5.0)
Cholesterol, Total: 121 mg/dL (ref 100–199)
HDL: 32 mg/dL — ABNORMAL LOW (ref >39)
LDL Chol Calc (NIH): 63 mg/dL (ref 0–99)
Triglycerides: 149 mg/dL (ref 0–149)
VLDL Cholesterol Cal: 26 mg/dL (ref 5–40)

## 2021-06-20 DIAGNOSIS — N3281 Overactive bladder: Secondary | ICD-10-CM | POA: Insufficient documentation

## 2021-06-20 DIAGNOSIS — J302 Other seasonal allergic rhinitis: Secondary | ICD-10-CM | POA: Insufficient documentation

## 2021-07-14 DIAGNOSIS — L918 Other hypertrophic disorders of the skin: Secondary | ICD-10-CM | POA: Diagnosis not present

## 2021-07-14 DIAGNOSIS — L820 Inflamed seborrheic keratosis: Secondary | ICD-10-CM | POA: Diagnosis not present

## 2021-07-14 DIAGNOSIS — D485 Neoplasm of uncertain behavior of skin: Secondary | ICD-10-CM | POA: Diagnosis not present

## 2021-07-14 DIAGNOSIS — D225 Melanocytic nevi of trunk: Secondary | ICD-10-CM | POA: Diagnosis not present

## 2021-08-27 ENCOUNTER — Other Ambulatory Visit: Payer: Self-pay | Admitting: Family Medicine

## 2021-08-27 DIAGNOSIS — E782 Mixed hyperlipidemia: Secondary | ICD-10-CM

## 2021-08-27 DIAGNOSIS — I10 Essential (primary) hypertension: Secondary | ICD-10-CM

## 2021-10-14 ENCOUNTER — Encounter: Payer: Self-pay | Admitting: *Deleted

## 2021-12-14 ENCOUNTER — Encounter: Payer: Medicare HMO | Admitting: Family Medicine

## 2021-12-15 ENCOUNTER — Encounter: Payer: Medicare HMO | Admitting: Family Medicine

## 2022-01-18 ENCOUNTER — Encounter: Payer: Self-pay | Admitting: Family Medicine

## 2022-01-18 ENCOUNTER — Ambulatory Visit (INDEPENDENT_AMBULATORY_CARE_PROVIDER_SITE_OTHER): Payer: Medicare HMO | Admitting: Family Medicine

## 2022-01-18 VITALS — BP 131/78 | HR 56 | Temp 97.9°F | Ht 68.0 in | Wt 205.0 lb

## 2022-01-18 DIAGNOSIS — Z0001 Encounter for general adult medical examination with abnormal findings: Secondary | ICD-10-CM

## 2022-01-18 DIAGNOSIS — I10 Essential (primary) hypertension: Secondary | ICD-10-CM | POA: Diagnosis not present

## 2022-01-18 DIAGNOSIS — Z7189 Other specified counseling: Secondary | ICD-10-CM | POA: Diagnosis not present

## 2022-01-18 DIAGNOSIS — E782 Mixed hyperlipidemia: Secondary | ICD-10-CM

## 2022-01-18 DIAGNOSIS — Z0000 Encounter for general adult medical examination without abnormal findings: Secondary | ICD-10-CM

## 2022-01-18 DIAGNOSIS — N3281 Overactive bladder: Secondary | ICD-10-CM | POA: Diagnosis not present

## 2022-01-18 DIAGNOSIS — Z125 Encounter for screening for malignant neoplasm of prostate: Secondary | ICD-10-CM | POA: Diagnosis not present

## 2022-01-18 MED ORDER — LOSARTAN POTASSIUM 100 MG PO TABS: 100.0000 mg | ORAL_TABLET | Freq: Every day | ORAL | 3 refills | Status: DC

## 2022-01-18 MED ORDER — ATORVASTATIN CALCIUM 10 MG PO TABS: ORAL_TABLET | ORAL | 3 refills | Status: DC

## 2022-01-18 NOTE — Patient Instructions (Addendum)
Schedule your eye exam and a visit to the dentist.  ?

## 2022-01-18 NOTE — Progress Notes (Signed)
? ?Assessment & Plan:  ?1. Well adult exam ?Preventive health education provided. ?- CBC with Differential/Platelet ?- CMP14+EGFR ?- Lipid panel ?- PSA, total and free ? ?2. ACP (advance care planning) ?Patient is going to work on this. ? ?3. Essential hypertension ?Well controlled on current regimen.  ?- losartan (COZAAR) 100 MG tablet; Take 1 tablet (100 mg total) by mouth daily.  Dispense: 90 tablet; Refill: 3 ?- CBC with Differential/Platelet ?- CMP14+EGFR ?- Lipid panel ? ?4. Mixed hyperlipidemia ?Well controlled on current regimen.  ?- atorvastatin (LIPITOR) 10 MG tablet; One tablet daily  Dispense: 90 tablet; Refill: 3 ?- CMP14+EGFR ?- Lipid panel ? ?5. Overactive bladder ?Lifestyle changes. Patient declines medication.  ? ? ?Follow-up: Return in about 1 year (around 01/19/2023) for annual physical.  ? ?Hendricks Limes, MSN, APRN, FNP-C ?[place] ? ?Subjective:  ?Patient ID: Dakota Cooke, male    DOB: [birth year]  Age: 66 y.o. MRN: [id number] ? ?Patient Care Team: ?Loman Brooklyn, FNP as PCP - General (Family Medicine) ?Armbruster, Carlota Raspberry, MD as Consulting Physician (Gastroenterology) ?Franchot Gallo, MD as Consulting Physician (Urology)  ? ?CC:  ?Chief Complaint  ?Patient presents with  ? Annual Exam  ? ? ?HPI ?Jammal Sarr presents for his annual physical. ? ?Occupation: retired, Marital status: married, Substance use: none ?Diet: regular, Exercise: stays active ?Last eye exam: long time ago; states he needs glasses ?Last dental exam: long time ago ?Last colonoscopy: 06/03/2020 with recommended repeat in 3 years ?AAA Screening: agreeable to complete ?Hepatitis C Screening: negative on 01/09/2020 ?PSA: WNL in 2020 ?Immunizations:  ?Tdap Vaccine: up to date  ?Shingrix Vaccine: at the pharmacy for administration, so it can be run through insurance first   ?COVID-19 Vaccine:  declined booster ?Pneumonia Vaccine: declined ? ?Advanced Directives ?Patient does not have advanced  directives including DNR, living will, healthcare power of attorney, financial power of attorney, and MOST form.  ? ?DEPRESSION SCREENING ? ?  01/18/2022  ?  9:06 AM 06/17/2021  ?  9:01 AM 01/19/2021  ?  8:25 AM 12/16/2020  ?  8:36 AM 11/04/2020  ?  3:00 PM 03/16/2020  ?  8:23 AM 10/14/2019  ?  9:01 AM  ?PHQ 2/9 Scores  ?PHQ - 2 Score 0 0 0 0 0 0 0  ?PHQ- 9 Score 0 1       ?  ? ?Hypertension: Patient has been spot checking his blood pressure at home every 2-3 weeks and reports it is normally 140-145/80-85. He does eat a low salt diet. No structured exercise, but he does stay active. ?  ?Overactive bladder: patient has seen urology. Flomax was discontinued and he was told his prostate is fine, but that he has an overactive bladder. He was offered medication, but declined.  ? ?Review of Systems  ?Constitutional:  Negative for chills, fever, malaise/fatigue and weight loss.  ?HENT:  Negative for congestion, ear discharge, ear pain, nosebleeds, sinus pain, sore throat and tinnitus.   ?Eyes:  Positive for blurred vision. Negative for double vision, pain, discharge and redness.  ?Respiratory:  Negative for cough, shortness of breath and wheezing.   ?Cardiovascular:  Negative for chest pain, palpitations and leg swelling.  ?Gastrointestinal:  Negative for abdominal pain, constipation, diarrhea, heartburn, nausea and vomiting.  ?Genitourinary:  Negative for dysuria, frequency and urgency.  ?Musculoskeletal:  Negative for myalgias.  ?Skin:  Negative for rash.  ?Neurological:  Negative for dizziness, seizures, weakness and headaches.  ?Psychiatric/Behavioral:  Negative for depression, substance abuse  and suicidal ideas. The patient is not nervous/anxious.   ? ? ?Current Outpatient Medications:  ?  aspirin 81 MG EC tablet, Take 81 mg by mouth daily. Swallow whole., Disp: , Rfl:  ?  atorvastatin (LIPITOR) 10 MG tablet, TAKE 1 TABLET BY MOUTH ONCE DAILY AT  6  PM, Disp: 90 tablet, Rfl: 1 ?  losartan (COZAAR) 100 MG tablet, Take 1 tablet by  mouth once daily, Disp: 90 tablet, Rfl: 1 ? ?No Known Allergies ? ?Past Medical History:  ?Diagnosis Date  ? BPH (benign prostatic hyperplasia)   ? Hyperlipidemia 10/15/2019  ? Hypertension   ? Personal history of COVID-19 11/2019  ? ? ?Past Surgical History:  ?Procedure Laterality Date  ? BACK SURGERY  1991  ? lower thoracic spine fusion to L3 level  ? COLONOSCOPY  2015  ? multiple TA's  ? POLYPECTOMY  2015  ? multiple TA's  ? ? ?Family History  ?Problem Relation Age of Onset  ? Hypertension Mother   ? Kidney cancer Mother   ? Colon cancer Mother   ?     passed at age 68  ? Kidney disease Mother   ?     93 when passed from kidney cancer  ? Parkinson's disease Father   ? Breast cancer Sister 86  ? Aneurysm Maternal Grandmother   ?     brain  ? Hypertension Son   ? Colon cancer Maternal Uncle 40  ? Colon polyps Neg Hx   ? Rectal cancer Neg Hx   ? Esophageal cancer Neg Hx   ? Stomach cancer Neg Hx   ? ? ?Social History  ? ?Socioeconomic History  ? Marital status: Married  ?  Spouse name: Not on file  ? Number of children: 3  ? Years of education: Not on file  ? Highest education level: Not on file  ?Occupational History  ? Occupation: retired  ?Tobacco Use  ? Smoking status: Former  ?  Types: Cigarettes  ?  Quit date: 1989  ?  Years since quitting: 34.2  ? Smokeless tobacco: Never  ?Vaping Use  ? Vaping Use: Never used  ?Substance and Sexual Activity  ? Alcohol use: Yes  ?  Alcohol/week: 1.0 - 2.0 standard drink  ?  Types: 1 - 2 Cans of beer per week  ?  Comment: occassionally  ? Drug use: Never  ? Sexual activity: Not on file  ?Other Topics Concern  ? Not on file  ?Social History Narrative  ? Not on file  ? ?Social Determinants of Health  ? ?Financial Resource Strain: Low Risk   ? Difficulty of Paying Living Expenses: Not hard at all  ?Food Insecurity: No Food Insecurity  ? Worried About Charity fundraiser in the Last Year: Never true  ? Ran Out of Food in the Last Year: Never true  ?Transportation Needs: No  Transportation Needs  ? Lack of Transportation (Medical): No  ? Lack of Transportation (Non-Medical): No  ?Physical Activity: Insufficiently Active  ? Days of Exercise per Week: 3 days  ? Minutes of Exercise per Session: 30 min  ?Stress: No Stress Concern Present  ? Feeling of Stress : Not at all  ?Social Connections: Moderately Isolated  ? Frequency of Communication with Friends and Family: More than three times a week  ? Frequency of Social Gatherings with Friends and Family: Once a week  ? Attends Religious Services: Never  ? Active Member of Clubs or Organizations: No  ? Attends  Club or Organization Meetings: Never  ? Marital Status: Married  ?Intimate Partner Violence: Not At Risk  ? Fear of Current or Ex-Partner: No  ? Emotionally Abused: No  ? Physically Abused: No  ? Sexually Abused: No  ? ? ?  ?Objective:  ?  ?BP 131/78   Pulse (!) 56   Temp 97.9 ?F (36.6 ?C) (Temporal)   Ht 5' 8"  (1.727 m)   Wt 205 lb (93 kg)   SpO2 95%   BMI 31.17 kg/m?  ? ?Wt Readings from Last 3 Encounters:  ?01/18/22 205 lb (93 kg)  ?06/17/21 210 lb 9.6 oz (95.5 kg)  ?01/19/21 204 lb (92.5 kg)  ? ? ?Physical Exam ?Vitals reviewed.  ?Constitutional:   ?   General: He is not in acute distress. ?   Appearance: Normal appearance. He is obese. He is not ill-appearing, toxic-appearing or diaphoretic.  ?HENT:  ?   Head: Normocephalic and atraumatic.  ?   Right Ear: Tympanic membrane, ear canal and external ear normal. There is no impacted cerumen.  ?   Left Ear: Tympanic membrane, ear canal and external ear normal. There is no impacted cerumen.  ?   Nose: Nose normal. No congestion or rhinorrhea.  ?   Mouth/Throat:  ?   Mouth: Mucous membranes are moist.  ?   Pharynx: Oropharynx is clear. No oropharyngeal exudate or posterior oropharyngeal erythema.  ?Eyes:  ?   General: No scleral icterus.    ?   Right eye: No discharge.     ?   Left eye: No discharge.  ?   Conjunctiva/sclera: Conjunctivae normal.  ?   Pupils: Pupils are equal, round,  and reactive to light.  ?Neck:  ?   Vascular: No carotid bruit.  ?Cardiovascular:  ?   Rate and Rhythm: Normal rate and regular rhythm.  ?   Heart sounds: Normal heart sounds. No murmur heard. ?  No friction rub. No gall

## 2022-01-19 ENCOUNTER — Encounter: Payer: Self-pay | Admitting: Family Medicine

## 2022-01-19 LAB — CMP14+EGFR
ALT: 20 IU/L (ref 0–44)
AST: 20 IU/L (ref 0–40)
Albumin/Globulin Ratio: 2.3 — ABNORMAL HIGH (ref 1.2–2.2)
Albumin: 4.6 g/dL (ref 3.8–4.8)
Alkaline Phosphatase: 67 IU/L (ref 44–121)
BUN/Creatinine Ratio: 15 (ref 10–24)
BUN: 15 mg/dL (ref 8–27)
Bilirubin Total: 0.6 mg/dL (ref 0.0–1.2)
CO2: 22 mmol/L (ref 20–29)
Calcium: 9.3 mg/dL (ref 8.6–10.2)
Chloride: 108 mmol/L — ABNORMAL HIGH (ref 96–106)
Creatinine, Ser: 1 mg/dL (ref 0.76–1.27)
Globulin, Total: 2 g/dL (ref 1.5–4.5)
Glucose: 99 mg/dL (ref 70–99)
Potassium: 4.8 mmol/L (ref 3.5–5.2)
Sodium: 145 mmol/L — ABNORMAL HIGH (ref 134–144)
Total Protein: 6.6 g/dL (ref 6.0–8.5)
eGFR: 82 mL/min/{1.73_m2} (ref >59)

## 2022-01-19 LAB — LIPID PANEL
Chol/HDL Ratio: 3.8 ratio (ref 0.0–5.0)
Cholesterol, Total: 127 mg/dL (ref 100–199)
HDL: 33 mg/dL — ABNORMAL LOW (ref >39)
LDL Chol Calc (NIH): 70 mg/dL (ref 0–99)
Triglycerides: 134 mg/dL (ref 0–149)
VLDL Cholesterol Cal: 24 mg/dL (ref 5–40)

## 2022-01-19 LAB — CBC WITH DIFFERENTIAL/PLATELET
Basophils Absolute: 0.1 10*3/uL (ref 0.0–0.2)
Basos: 1 %
EOS (ABSOLUTE): 0.1 10*3/uL (ref 0.0–0.4)
Eos: 2 %
Hematocrit: 47.4 % (ref 37.5–51.0)
Hemoglobin: 15.9 g/dL (ref 13.0–17.7)
Immature Grans (Abs): 0 10*3/uL (ref 0.0–0.1)
Immature Granulocytes: 1 %
Lymphocytes Absolute: 2.5 10*3/uL (ref 0.7–3.1)
Lymphs: 39 %
MCH: 29.8 pg (ref 26.6–33.0)
MCHC: 33.5 g/dL (ref 31.5–35.7)
MCV: 89 fL (ref 79–97)
Monocytes Absolute: 0.6 10*3/uL (ref 0.1–0.9)
Monocytes: 9 %
Neutrophils Absolute: 3.2 10*3/uL (ref 1.4–7.0)
Neutrophils: 48 %
Platelets: 166 10*3/uL (ref 150–450)
RBC: 5.34 x10E6/uL (ref 4.14–5.80)
RDW: 13.2 % (ref 11.6–15.4)
WBC: 6.5 10*3/uL (ref 3.4–10.8)

## 2022-01-19 LAB — PSA, TOTAL AND FREE
PSA, Free Pct: 22.4 %
PSA, Free: 0.38 ng/mL
Prostate Specific Ag, Serum: 1.7 ng/mL (ref 0.0–4.0)

## 2022-01-20 ENCOUNTER — Ambulatory Visit: Payer: Medicare HMO

## 2022-01-31 ENCOUNTER — Ambulatory Visit (INDEPENDENT_AMBULATORY_CARE_PROVIDER_SITE_OTHER): Payer: Medicare HMO

## 2022-01-31 VITALS — Wt 205.0 lb

## 2022-01-31 DIAGNOSIS — Z0000 Encounter for general adult medical examination without abnormal findings: Secondary | ICD-10-CM

## 2022-01-31 NOTE — Progress Notes (Signed)
? ?Subjective:  ? Drexler Maland is a 66 y.o. male who presents for Medicare Annual/Subsequent preventive examination. ? ?Virtual Visit via Telephone Note ? ?I connected with  Shirlean Schlein on 01/31/22 at  8:15 AM EDT by telephone and verified that I am speaking with the correct person using two identifiers. ? ?Location: ?Patient: Home ?Provider: [facility] ?Persons participating in the virtual visit: patient/Nurse Health Advisor ?  ?I discussed the limitations, risks, security and privacy concerns of performing an evaluation and management service by telephone and the availability of in person appointments. The patient expressed understanding and agreed to proceed. ? ?Interactive audio and video telecommunications were attempted between this nurse and patient, however failed, due to patient having technical difficulties OR patient did not have access to video capability.  We continued and completed visit with audio only. ? ?Some vital signs may be absent or patient reported.  ? ?Kenitha Glendinning Dionne Ano, LPN  ? ?Review of Systems    ? ?Cardiac Risk Factors include: advanced age (>21mn, >>10women);male gender;dyslipidemia;hypertension;obesity (BMI >30kg/m2) ? ?   ?Objective:  ?  ?Today's Vitals  ? 01/31/22 0819  ?Weight: 205 lb (93 kg)  ? ?Body mass index is 31.17 kg/m?. ? ? ?  01/31/2022  ?  8:32 AM 01/19/2021  ?  8:31 AM 11/29/2019  ?  4:00 PM 11/29/2019  ?  6:51 AM  ?Advanced Directives  ?Does Patient Have a Medical Advance Directive? No No No No  ?Would patient like information on creating a medical advance directive? No - Patient declined No - Patient declined No - Patient declined No - Patient declined  ? ? ?Current Medications (verified) ?Outpatient Encounter Medications as of 01/31/2022  ?Medication Sig  ? aspirin 81 MG EC tablet Take 81 mg by mouth daily. Swallow whole.  ? atorvastatin (LIPITOR) 10 MG tablet One tablet daily  ? losartan (COZAAR) 100 MG tablet Take 1 tablet (100 mg total) by mouth daily.  ? ?No  facility-administered encounter medications on file as of 01/31/2022.  ? ? ?Allergies (verified) ?Patient has no known allergies.  ? ?History: ?Past Medical History:  ?Diagnosis Date  ? BPH (benign prostatic hyperplasia)   ? Hyperlipidemia 10/15/2019  ? Hypertension   ? Personal history of COVID-19 11/2019  ? ?Past Surgical History:  ?Procedure Laterality Date  ? BACK SURGERY  1991  ? lower thoracic spine fusion to L3 level  ? COLONOSCOPY  2015  ? multiple TA's  ? POLYPECTOMY  2015  ? multiple TA's  ? ?Family History  ?Problem Relation Age of Onset  ? Hypertension Mother   ? Kidney cancer Mother   ? Colon cancer Mother   ?     passed at age 68 ? Kidney disease Mother   ?     93 when passed from kidney cancer  ? Parkinson's disease Father   ? Breast cancer Sister 658 ? Aneurysm Maternal Grandmother   ?     brain  ? Hypertension Son   ? Colon cancer Maternal Uncle 843 ? Colon polyps Neg Hx   ? Rectal cancer Neg Hx   ? Esophageal cancer Neg Hx   ? Stomach cancer Neg Hx   ? ?Social History  ? ?Socioeconomic History  ? Marital status: Married  ?  Spouse name: Not on file  ? Number of children: 3  ? Years of education: Not on file  ? Highest education level: Not on file  ?Occupational History  ? Occupation: retired  ?Tobacco  Use  ? Smoking status: Former  ?  Types: Cigarettes  ?  Quit date: 1989  ?  Years since quitting: 34.3  ? Smokeless tobacco: Never  ?Vaping Use  ? Vaping Use: Never used  ?Substance and Sexual Activity  ? Alcohol use: Yes  ?  Alcohol/week: 1.0 - 2.0 standard drink  ?  Types: 1 - 2 Cans of beer per week  ?  Comment: occassionally  ? Drug use: Never  ? Sexual activity: Not on file  ?Other Topics Concern  ? Not on file  ?Social History Narrative  ? Children live out of town  ? He lives in one level home with wife and cares for her as she is disabled.   ? ?Social Determinants of Health  ? ?Financial Resource Strain: Low Risk   ? Difficulty of Paying Living Expenses: Not hard at all  ?Food Insecurity: No  Food Insecurity  ? Worried About Charity fundraiser in the Last Year: Never true  ? Ran Out of Food in the Last Year: Never true  ?Transportation Needs: No Transportation Needs  ? Lack of Transportation (Medical): No  ? Lack of Transportation (Non-Medical): No  ?Physical Activity: Sufficiently Active  ? Days of Exercise per Week: 7 days  ? Minutes of Exercise per Session: 30 min  ?Stress: No Stress Concern Present  ? Feeling of Stress : Not at all  ?Social Connections: Moderately Isolated  ? Frequency of Communication with Friends and Family: More than three times a week  ? Frequency of Social Gatherings with Friends and Family: Once a week  ? Attends Religious Services: Never  ? Active Member of Clubs or Organizations: No  ? Attends Archivist Meetings: Never  ? Marital Status: Married  ? ? ?Tobacco Counseling ?Counseling given: Not Answered ? ? ?Clinical Intake: ? ?Pre-visit preparation completed: Yes ? ?Pain : No/denies pain ? ?  ? ?BMI - recorded: 31.17 ?Nutritional Status: BMI > 30  Obese ?Nutritional Risks: None ?Diabetes: No ? ?How often do you need to have someone help you when you read instructions, pamphlets, or other written materials from your doctor or pharmacy?: 1 - Never ? ?Diabetic? no ? ?Interpreter Needed?: No ? ?Information entered by :: Titania Gault, LPN ? ? ?Activities of Daily Living ? ?  01/31/2022  ?  8:24 AM  ?In your present state of health, do you have any difficulty performing the following activities:  ?Hearing? 1  ?Comment mild  ?Vision? 0  ?Difficulty concentrating or making decisions? 0  ?Walking or climbing stairs? 0  ?Dressing or bathing? 0  ?Doing errands, shopping? 0  ?Preparing Food and eating ? N  ?Using the Toilet? N  ?In the past six months, have you accidently leaked urine? N  ?Do you have problems with loss of bowel control? N  ?Managing your Medications? N  ?Managing your Finances? N  ?Housekeeping or managing your Housekeeping? N  ? ? ?Patient Care Team: ?Loman Brooklyn, FNP as PCP - General (Family Medicine) ?Armbruster, Carlota Raspberry, MD as Consulting Physician (Gastroenterology) ?Franchot Gallo, MD as Consulting Physician (Urology) ? ?Indicate any recent Medical Services you may have received from other than [facility] providers in the past year (date may be approximate). ? ?   ?Assessment:  ? This is a routine wellness examination for Loris. ? ?Hearing/Vision screen ?Hearing Screening - Comments:: C/o mild hearing difficulties  - declines hearing aids ?Vision Screening - Comments:: Wears rx glasses - hasn't had eye  exam in years - no current optometrist ? ?Dietary issues and exercise activities discussed: ?Current Exercise Habits: Home exercise routine, Type of exercise: walking;Other - see comments (yard work, house work, caring for disabled wife), Time (Minutes): 30, Frequency (Times/Week): 7, Weekly Exercise (Minutes/Week): 210, Intensity: Mild, Exercise limited by: None identified ? ? Goals Addressed   ? ?  ?  ?  ?  ? This Visit's Progress  ?  Patient Stated   On track  ?  Stay active and healthy ?  ? ?  ? ?Depression Screen ? ?  01/31/2022  ?  8:22 AM 01/18/2022  ?  9:06 AM 06/17/2021  ?  9:01 AM 01/19/2021  ?  8:25 AM 12/16/2020  ?  8:36 AM 11/04/2020  ?  3:00 PM 03/16/2020  ?  8:23 AM  ?PHQ 2/9 Scores  ?PHQ - 2 Score 0 0 0 0 0 0 0  ?PHQ- 9 Score 1 0 1      ?  ?Fall Risk ? ?  01/31/2022  ?  8:21 AM 01/18/2022  ?  9:06 AM 06/17/2021  ?  9:01 AM 01/19/2021  ?  8:31 AM 12/16/2020  ?  8:36 AM  ?Fall Risk   ?Falls in the past year? 0 0 0 0 0  ?Number falls in past yr: 0   0   ?Injury with Fall? 0   0   ?Risk for fall due to : No Fall Risks   No Fall Risks   ?Follow up Falls prevention discussed   Falls prevention discussed   ? ? ?FALL RISK PREVENTION PERTAINING TO THE HOME: ? ?Any stairs in or around the home? No  ?If so, are there any without handrails? No  ?Home free of loose throw rugs in walkways, pet beds, electrical cords, etc? Yes  ?Adequate lighting in your home to reduce risk of falls?  Yes  ? ?ASSISTIVE DEVICES UTILIZED TO PREVENT FALLS: ? ?Life alert? No  ?Use of a cane, walker or w/c? No  ?Grab bars in the bathroom? Yes  ?Shower chair or bench in shower? Yes  ?Elevated toilet seat or a handicapped t

## 2022-01-31 NOTE — Patient Instructions (Signed)
Dakota Cooke , ?Thank you for taking time to come for your Medicare Wellness Visit. I appreciate your ongoing commitment to your health goals. Please review the following plan we discussed and let me know if I can assist you in the future.  ? ?Screening recommendations/referrals: ?Colonoscopy: Done 06/03/2020 - Repeat in 3 years ?Recommended yearly ophthalmology/optometry visit for glaucoma screening and checkup ?Recommended yearly dental visit for hygiene and checkup ? ?Vaccinations: ?Influenza vaccine: Declined ?Pneumococcal vaccine: Due - consider once per lifetime [id number] ?Tdap vaccine: Done 01/23/2013 - Repeat in 10 years ?Shingles vaccine: Due - Shingrix is 2 doses 2-6 months apart and over 90% effective     ?Covid-19: Done  03/17/2020, 04/14/2020, & 10/20/2020 ? ?Advanced directives: Advance directive discussed with you today. Even though you declined this today, please call our office should you change your mind, and we can give you the proper paperwork for you to fill out.  ? ?Conditions/risks identified: Aim for 30 minutes of exercise or brisk walking, 6-8 glasses of water, and 5 servings of fruits and vegetables each day.  ? ?Next appointment: Follow up in one year for your annual wellness visit.  ? ?Preventive Care 68 Years and Older, Male ? ?Preventive care refers to lifestyle choices and visits with your health care provider that can promote health and wellness. ?What does preventive care include? ?A yearly physical exam. This is also called an annual well check. ?Dental exams once or twice a year. ?Routine eye exams. Ask your health care provider how often you should have your eyes checked. ?Personal lifestyle choices, including: ?Daily care of your teeth and gums. ?Regular physical activity. ?Eating a healthy diet. ?Avoiding tobacco and drug use. ?Limiting alcohol use. ?Practicing safe sex. ?Taking low doses of aspirin every day. ?Taking vitamin and mineral supplements as recommended by your health care  provider. ?What happens during an annual well check? ?The services and screenings done by your health care provider during your annual well check will depend on your age, overall health, lifestyle risk factors, and family history of disease. ?Counseling  ?Your health care provider may ask you questions about your: ?Alcohol use. ?Tobacco use. ?Drug use. ?Emotional well-being. ?Home and relationship well-being. ?Sexual activity. ?Eating habits. ?History of falls. ?Memory and ability to understand (cognition). ?Work and work Statistician. ?Screening  ?You may have the following tests or measurements: ?Height, weight, and BMI. ?Blood pressure. ?Lipid and cholesterol levels. These may be checked every 5 years, or more frequently if you are over 14 years old. ?Skin check. ?Lung cancer screening. You may have this screening every year starting at age 68 if you have a 30-pack-year history of smoking and currently smoke or have quit within the past 15 years. ?Fecal occult blood test (FOBT) of the stool. You may have this test every year starting at age 68. ?Flexible sigmoidoscopy or colonoscopy. You may have a sigmoidoscopy every 5 years or a colonoscopy every 10 years starting at age 68. ?Prostate cancer screening. Recommendations will vary depending on your family history and other risks. ?Hepatitis C blood test. ?Hepatitis B blood test. ?Sexually transmitted disease (STD) testing. ?Diabetes screening. This is done by checking your blood sugar (glucose) after you have not eaten for a while (fasting). You may have this done every 1-3 years. ?Abdominal aortic aneurysm (AAA) screening. You may need this if you are a current or former smoker. ?Osteoporosis. You may be screened starting at age 68 if you are at high risk. ?Talk with your health care provider  about your test results, treatment options, and if necessary, the need for more tests. ?Vaccines  ?Your health care provider may recommend certain vaccines, such  as: ?Influenza vaccine. This is recommended every year. ?Tetanus, diphtheria, and acellular pertussis (Tdap, Td) vaccine. You may need a Td booster every 10 years. ?Zoster vaccine. You may need this after age 40. ?Pneumococcal 13-valent conjugate (PCV13) vaccine. One dose is recommended after age 52. ?Pneumococcal polysaccharide (PPSV23) vaccine. One dose is recommended after age 42. ?Talk to your health care provider about which screenings and vaccines you need and how often you need them. ?This information is not intended to replace advice given to you by your health care provider. Make sure you discuss any questions you have with your health care provider. ?Document Released: 10/29/2015 Document Revised: 06/21/2016 Document Reviewed: 08/03/2015 ?Elsevier Interactive Patient Education ? 2017 [place]. ? ?Fall Prevention in the Home ?Falls can cause injuries. They can happen to people of all ages. There are many things you can do to make your home safe and to help prevent falls. ?What can I do on the outside of my home? ?Regularly fix the edges of walkways and driveways and fix any cracks. ?Remove anything that might make you trip as you walk through a door, such as a raised step or threshold. ?Trim any bushes or trees on the path to your home. ?Use bright outdoor lighting. ?Clear any walking paths of anything that might make someone trip, such as rocks or tools. ?Regularly check to see if handrails are loose or broken. Make sure that both sides of any steps have handrails. ?Any raised decks and porches should have guardrails on the edges. ?Have any leaves, snow, or ice cleared regularly. ?Use sand or salt on walking paths during winter. ?Clean up any spills in your garage right away. This includes oil or grease spills. ?What can I do in the bathroom? ?Use night lights. ?Install grab bars by the toilet and in the tub and shower. Do not use towel bars as grab bars. ?Use non-skid mats or decals in the tub or  shower. ?If you need to sit down in the shower, use a plastic, non-slip stool. ?Keep the floor dry. Clean up any water that spills on the floor as soon as it happens. ?Remove soap buildup in the tub or shower regularly. ?Attach bath mats securely with double-sided non-slip rug tape. ?Do not have throw rugs and other things on the floor that can make you trip. ?What can I do in the bedroom? ?Use night lights. ?Make sure that you have a light by your bed that is easy to reach. ?Do not use any sheets or blankets that are too big for your bed. They should not hang down onto the floor. ?Have a firm chair that has side arms. You can use this for support while you get dressed. ?Do not have throw rugs and other things on the floor that can make you trip. ?What can I do in the kitchen? ?Clean up any spills right away. ?Avoid walking on wet floors. ?Keep items that you use a lot in easy-to-reach places. ?If you need to reach something above you, use a strong step stool that has a grab bar. ?Keep electrical cords out of the way. ?Do not use floor polish or wax that makes floors slippery. If you must use wax, use non-skid floor wax. ?Do not have throw rugs and other things on the floor that can make you trip. ?What can I do  with my stairs? ?Do not leave any items on the stairs. ?Make sure that there are handrails on both sides of the stairs and use them. Fix handrails that are broken or loose. Make sure that handrails are as long as the stairways. ?Check any carpeting to make sure that it is firmly attached to the stairs. Fix any carpet that is loose or worn. ?Avoid having throw rugs at the top or bottom of the stairs. If you do have throw rugs, attach them to the floor with carpet tape. ?Make sure that you have a light switch at the top of the stairs and the bottom of the stairs. If you do not have them, ask someone to add them for you. ?What else can I do to help prevent falls? ?Wear shoes that: ?Do not have high heels. ?Have  rubber bottoms. ?Are comfortable and fit you well. ?Are closed at the toe. Do not wear sandals. ?If you use a stepladder: ?Make sure that it is fully opened. Do not climb a closed stepladder. ?Make sure that both sid

## 2022-04-11 ENCOUNTER — Ambulatory Visit: Payer: Medicare HMO | Admitting: Urology

## 2022-06-21 DIAGNOSIS — B354 Tinea corporis: Secondary | ICD-10-CM | POA: Diagnosis not present

## 2023-01-24 ENCOUNTER — Ambulatory Visit (INDEPENDENT_AMBULATORY_CARE_PROVIDER_SITE_OTHER): Payer: Medicare HMO | Admitting: Family Medicine

## 2023-01-24 ENCOUNTER — Encounter: Payer: Medicare HMO | Admitting: Family Medicine

## 2023-01-24 ENCOUNTER — Encounter: Payer: Self-pay | Admitting: Family Medicine

## 2023-01-24 VITALS — BP 135/83 | HR 65 | Temp 98.3°F | Ht 68.0 in | Wt 209.8 lb

## 2023-01-24 DIAGNOSIS — I10 Essential (primary) hypertension: Secondary | ICD-10-CM

## 2023-01-24 DIAGNOSIS — Z0001 Encounter for general adult medical examination with abnormal findings: Secondary | ICD-10-CM | POA: Diagnosis not present

## 2023-01-24 DIAGNOSIS — E782 Mixed hyperlipidemia: Secondary | ICD-10-CM | POA: Diagnosis not present

## 2023-01-24 DIAGNOSIS — Z125 Encounter for screening for malignant neoplasm of prostate: Secondary | ICD-10-CM

## 2023-01-24 DIAGNOSIS — M2559 Pain in other specified joint: Secondary | ICD-10-CM | POA: Diagnosis not present

## 2023-01-24 DIAGNOSIS — Z0000 Encounter for general adult medical examination without abnormal findings: Secondary | ICD-10-CM | POA: Diagnosis not present

## 2023-01-24 LAB — URINALYSIS
Bilirubin, UA: NEGATIVE
Glucose, UA: NEGATIVE
Nitrite, UA: NEGATIVE
RBC, UA: NEGATIVE
Specific Gravity, UA: 1.025 (ref 1.005–1.030)
Urobilinogen, Ur: 0.2 mg/dL (ref 0.2–1.0)
pH, UA: 5.5 (ref 5.0–7.5)

## 2023-01-24 LAB — CMP14+EGFR

## 2023-01-24 LAB — LIPID PANEL

## 2023-01-24 LAB — PSA, TOTAL AND FREE

## 2023-01-24 MED ORDER — ATORVASTATIN CALCIUM 10 MG PO TABS: ORAL_TABLET | ORAL | 3 refills | Status: DC

## 2023-01-24 MED ORDER — LOSARTAN POTASSIUM 100 MG PO TABS: 100.0000 mg | ORAL_TABLET | Freq: Every day | ORAL | 3 refills | Status: DC

## 2023-01-24 NOTE — Addendum Note (Signed)
Addended by: Diamantina Monks on: 01/24/2023 08:58 AM   Modules accepted: Orders

## 2023-01-24 NOTE — Progress Notes (Signed)
Subjective:  Patient ID: Dakota Cooke, male    DOB: November 09, 1955  Age: 67 y.o. MRN: [id number]  CC: Annual Exam   HPI Jairen Trembly presents for annual exam. Followed for HTN & cholesterol.     01/24/2023    8:06 AM 01/31/2022    8:22 AM 01/18/2022    9:06 AM  Depression screen PHQ 2/9  Decreased Interest 0 0 0  Down, Depressed, Hopeless 0 0 0  PHQ - 2 Score 0 0 0  Altered sleeping  1 0  Tired, decreased energy  0 0  Change in appetite  0 0  Feeling bad or failure about yourself   0 0  Trouble concentrating  0 0  Moving slowly or fidgety/restless  0 0  Suicidal thoughts  0 0  PHQ-9 Score  1 0  Difficult doing work/chores  Not difficult at all Not difficult at all    History Gjon has a past medical history of BPH (benign prostatic hyperplasia), Hyperlipidemia (10/15/2019), Hypertension, and Personal history of COVID-19 (11/2019).   He has a past surgical history that includes Back surgery (1991); Colonoscopy (2015); and Polypectomy (2015).   His family history includes Aneurysm in his maternal grandmother; Breast cancer (age of onset: 44) in his sister; Colon cancer in his mother; Colon cancer (age of onset: 53) in his maternal uncle; Hypertension in his mother and son; Kidney cancer in his mother; Kidney disease in his mother; Parkinson's disease in his father.He reports that he quit smoking about 35 years ago. His smoking use included cigarettes. He has never used smokeless tobacco. He reports current alcohol use of about 1.0 - 2.0 standard drink of alcohol per week. He reports that he does not use drugs.    ROS Review of Systems  Constitutional:  Negative for activity change, fatigue and unexpected weight change.  HENT:  Positive for hearing loss. Negative for congestion, ear pain, postnasal drip and trouble swallowing.   Eyes:  Negative for pain and visual disturbance.  Respiratory:  Negative for cough, chest tightness and shortness of breath.   Cardiovascular:  Negative  for chest pain, palpitations and leg swelling.  Gastrointestinal:  Negative for abdominal distention, abdominal pain, blood in stool, constipation, diarrhea, nausea and vomiting.  Endocrine: Negative for cold intolerance, heat intolerance and polydipsia.  Genitourinary:  Negative for difficulty urinating, dysuria, flank pain, frequency and urgency.  Musculoskeletal:  Positive for arthralgias (left knee pain - did some flooring at home. Posterior pain and stiffness. ANkles and knees hurt daily. Admits buying cheap shoes.). Negative for joint swelling.  Skin:  Negative for color change, rash and wound.  Neurological:  Negative for dizziness, syncope, speech difficulty, weakness, light-headedness, numbness and headaches.  Hematological:  Does not bruise/bleed easily.  Psychiatric/Behavioral:  Negative for confusion, decreased concentration, dysphoric mood and sleep disturbance. The patient is not nervous/anxious.     Objective:  BP 135/83   Pulse 65   Temp 98.3 F (36.8 C)   Ht 5\' 8"  (1.727 m)   Wt 209 lb 12.8 oz (95.2 kg)   SpO2 95%   BMI 31.90 kg/m   BP Readings from Last 3 Encounters:  01/24/23 135/83  01/18/22 131/78  06/17/21 (!) 147/86    Wt Readings from Last 3 Encounters:  01/24/23 209 lb 12.8 oz (95.2 kg)  01/31/22 205 lb (93 kg)  01/18/22 205 lb (93 kg)     Physical Exam Constitutional:      Appearance: He is well-developed.  HENT:  Head: Normocephalic and atraumatic.  Eyes:     Pupils: Pupils are equal, round, and reactive to light.  Neck:     Thyroid: No thyromegaly.     Trachea: No tracheal deviation.  Cardiovascular:     Rate and Rhythm: Normal rate and regular rhythm.     Heart sounds: Normal heart sounds. No murmur heard.    No friction rub. No gallop.  Pulmonary:     Breath sounds: Normal breath sounds. No wheezing or rales.  Abdominal:     General: Bowel sounds are normal. There is no distension.     Palpations: Abdomen is soft. There is no mass.      Tenderness: There is no abdominal tenderness.     Hernia: There is no hernia in the left inguinal area.  Genitourinary:    Penis: Normal.      Testes: Normal.  Musculoskeletal:        General: Normal range of motion.     Cervical back: Normal range of motion.  Lymphadenopathy:     Cervical: No cervical adenopathy.  Skin:    General: Skin is warm and dry.  Neurological:     Mental Status: He is alert and oriented to person, place, and time.       Assessment & Plan:   Phyllis was seen today for annual exam.  Diagnoses and all orders for this visit:  Well adult exam -     CBC with Differential/Platelet -     CMP14+EGFR -     Lipid panel -     PSA, total and free -     Urinalysis  Mixed hyperlipidemia -     Lipid panel -     atorvastatin (LIPITOR) 10 MG tablet; One tablet daily  Essential hypertension -     CBC with Differential/Platelet -     CMP14+EGFR -     losartan (COZAAR) 100 MG tablet; Take 1 tablet (100 mg total) by mouth daily.  Prostate cancer screening -     PSA, total and free  Pain in other joint -     DG Knee 1-2 Views Left; Future -     DG Knee 1-2 Views Right; Future -     DG Ankle Complete Left; Future -     DG Ankle Complete Right; Future -     DG HIP UNILAT W OR W/O PELVIS 2-3 VIEWS LEFT; Future -     DG HIP UNILAT W OR W/O PELVIS 2-3 VIEWS RIGHT; Future       I am having Timonthy Demary maintain his aspirin EC, atorvastatin, and losartan.  Allergies as of 01/24/2023   No Known Allergies      Medication List        Accurate as of January 24, 2023  8:53 AM. If you have any questions, ask your nurse or doctor.          aspirin EC 81 MG tablet Take 81 mg by mouth daily. Swallow whole.   atorvastatin 10 MG tablet Commonly known as: LIPITOR One tablet daily   losartan 100 MG tablet Commonly known as: COZAAR Take 1 tablet (100 mg total) by mouth daily.         Follow-up: Return in about 1 year (around  01/24/2024).  Mechele Claude, M.D.

## 2023-01-25 LAB — CMP14+EGFR
ALT: 24 IU/L (ref 0–44)
AST: 21 IU/L (ref 0–40)
Albumin/Globulin Ratio: 2.3 — ABNORMAL HIGH (ref 1.2–2.2)
Alkaline Phosphatase: 68 IU/L (ref 44–121)
BUN/Creatinine Ratio: 17 (ref 10–24)
BUN: 16 mg/dL (ref 8–27)
Bilirubin Total: 0.6 mg/dL (ref 0.0–1.2)
CO2: 21 mmol/L (ref 20–29)
Chloride: 104 mmol/L (ref 96–106)
Glucose: 105 mg/dL — ABNORMAL HIGH (ref 70–99)
Potassium: 4.2 mmol/L (ref 3.5–5.2)
Sodium: 143 mmol/L (ref 134–144)
Total Protein: 7 g/dL (ref 6.0–8.5)
eGFR: 86 mL/min/{1.73_m2} (ref >59)

## 2023-01-25 LAB — LIPID PANEL
Chol/HDL Ratio: 3.9 ratio (ref 0.0–5.0)
Cholesterol, Total: 139 mg/dL (ref 100–199)
HDL: 36 mg/dL — ABNORMAL LOW (ref >39)
Triglycerides: 133 mg/dL (ref 0–149)
VLDL Cholesterol Cal: 24 mg/dL (ref 5–40)

## 2023-01-25 LAB — CBC WITH DIFFERENTIAL/PLATELET
Basophils Absolute: 0.1 10*3/uL (ref 0.0–0.2)
Basos: 1 %
EOS (ABSOLUTE): 0.2 10*3/uL (ref 0.0–0.4)
Eos: 2 %
Hematocrit: 49.1 % (ref 37.5–51.0)
Hemoglobin: 16.5 g/dL (ref 13.0–17.7)
Immature Grans (Abs): 0 10*3/uL (ref 0.0–0.1)
Immature Granulocytes: 0 %
Lymphocytes Absolute: 2.6 10*3/uL (ref 0.7–3.1)
Lymphs: 36 %
MCH: 30.6 pg (ref 26.6–33.0)
MCHC: 33.6 g/dL (ref 31.5–35.7)
MCV: 91 fL (ref 79–97)
Monocytes Absolute: 0.7 10*3/uL (ref 0.1–0.9)
Monocytes: 9 %
Neutrophils Absolute: 3.8 10*3/uL (ref 1.4–7.0)
Neutrophils: 52 %
Platelets: 193 10*3/uL (ref 150–450)
RBC: 5.4 x10E6/uL (ref 4.14–5.80)
RDW: 13.3 % (ref 11.6–15.4)
WBC: 7.3 10*3/uL (ref 3.4–10.8)

## 2023-01-25 LAB — PSA, TOTAL AND FREE
PSA, Free Pct: 26.5 %
PSA, Free: 0.45 ng/mL

## 2023-01-29 ENCOUNTER — Other Ambulatory Visit (INDEPENDENT_AMBULATORY_CARE_PROVIDER_SITE_OTHER): Payer: Medicare HMO

## 2023-01-29 DIAGNOSIS — M25552 Pain in left hip: Secondary | ICD-10-CM

## 2023-01-29 DIAGNOSIS — M25572 Pain in left ankle and joints of left foot: Secondary | ICD-10-CM | POA: Diagnosis not present

## 2023-01-29 DIAGNOSIS — M25561 Pain in right knee: Secondary | ICD-10-CM

## 2023-01-29 DIAGNOSIS — M25571 Pain in right ankle and joints of right foot: Secondary | ICD-10-CM | POA: Diagnosis not present

## 2023-01-29 DIAGNOSIS — M1712 Unilateral primary osteoarthritis, left knee: Secondary | ICD-10-CM | POA: Diagnosis not present

## 2023-01-29 DIAGNOSIS — M2559 Pain in other specified joint: Secondary | ICD-10-CM

## 2023-01-29 DIAGNOSIS — M25551 Pain in right hip: Secondary | ICD-10-CM

## 2023-01-29 DIAGNOSIS — M25562 Pain in left knee: Secondary | ICD-10-CM | POA: Diagnosis not present

## 2023-01-30 NOTE — Progress Notes (Signed)
Hello Dakota Cooke,  Your lab result is normal and/or stable.Some minor variations that are not significant are commonly marked abnormal, but do not represent any medical problem for you.  Best regards, Selmer Adduci, M.D.

## 2023-02-02 ENCOUNTER — Ambulatory Visit (INDEPENDENT_AMBULATORY_CARE_PROVIDER_SITE_OTHER): Payer: Medicare HMO

## 2023-02-02 VITALS — Ht 68.0 in | Wt 210.0 lb

## 2023-02-02 DIAGNOSIS — Z0100 Encounter for examination of eyes and vision without abnormal findings: Secondary | ICD-10-CM

## 2023-02-02 DIAGNOSIS — Z0000 Encounter for general adult medical examination without abnormal findings: Secondary | ICD-10-CM

## 2023-02-02 NOTE — Patient Instructions (Signed)
Dakota Cooke , Thank you for taking time to come for your Medicare Wellness Visit. I appreciate your ongoing commitment to your health goals. Please review the following plan we discussed and let me know if I can assist you in the future.   These are the goals we discussed:  Goals      Patient Stated     Stay active and healthy        This is a list of the screening recommended for you and due dates:  Health Maintenance  Topic Date Due   DTaP/Tdap/Td vaccine (3 - Td or Tdap) 01/24/2023   COVID-19 Vaccine (4 - 2023-24 season) 02/09/2023*   Zoster (Shingles) Vaccine (1 of 2) 04/25/2023*   Pneumonia Vaccine (1 of 1 - PCV) 01/24/2024*   Flu Shot  05/17/2023   Colon Cancer Screening  06/04/2023   Medicare Annual Wellness Visit  02/02/2024   Hepatitis C Screening: USPSTF Recommendation to screen - Ages 18-79 yo.  Completed   HPV Vaccine  Aged Out  *Topic was postponed. The date shown is not the original due date.    Advanced directives: Advance directive discussed with you today. I have provided a copy for you to complete at home and have notarized. Once this is complete please bring a copy in to our office so we can scan it into your chart.   Conditions/risks identified: Aim for 30 minutes of exercise or brisk walking, 6-8 glasses of water, and 5 servings of fruits and vegetables each day.   Next appointment: Follow up in one year for your annual wellness visit.   Preventive Care 53 Years and Older, Male  Preventive care refers to lifestyle choices and visits with your health care provider that can promote health and wellness. What does preventive care include? A yearly physical exam. This is also called an annual well check. Dental exams once or twice a year. Routine eye exams. Ask your health care provider how often you should have your eyes checked. Personal lifestyle choices, including: Daily care of your teeth and gums. Regular physical activity. Eating a healthy  diet. Avoiding tobacco and drug use. Limiting alcohol use. Practicing safe sex. Taking low doses of aspirin every day. Taking vitamin and mineral supplements as recommended by your health care provider. What happens during an annual well check? The services and screenings done by your health care provider during your annual well check will depend on your age, overall health, lifestyle risk factors, and family history of disease. Counseling  Your health care provider may ask you questions about your: Alcohol use. Tobacco use. Drug use. Emotional well-being. Home and relationship well-being. Sexual activity. Eating habits. History of falls. Memory and ability to understand (cognition). Work and work Astronomer. Screening  You may have the following tests or measurements: Height, weight, and BMI. Blood pressure. Lipid and cholesterol levels. These may be checked every 5 years, or more frequently if you are over 40 years old. Skin check. Lung cancer screening. You may have this screening every year starting at age 5 if you have a 30-pack-year history of smoking and currently smoke or have quit within the past 15 years. Fecal occult blood test (FOBT) of the stool. You may have this test every year starting at age 63. Flexible sigmoidoscopy or colonoscopy. You may have a sigmoidoscopy every 5 years or a colonoscopy every 10 years starting at age 1. Prostate cancer screening. Recommendations will vary depending on your family history and other risks. Hepatitis C blood  test. Hepatitis B blood test. Sexually transmitted disease (STD) testing. Diabetes screening. This is done by checking your blood sugar (glucose) after you have not eaten for a while (fasting). You may have this done every 1-3 years. Abdominal aortic aneurysm (AAA) screening. You may need this if you are a current or former smoker. Osteoporosis. You may be screened starting at age 28 if you are at high risk. Talk with  your health care provider about your test results, treatment options, and if necessary, the need for more tests. Vaccines  Your health care provider may recommend certain vaccines, such as: Influenza vaccine. This is recommended every year. Tetanus, diphtheria, and acellular pertussis (Tdap, Td) vaccine. You may need a Td booster every 10 years. Zoster vaccine. You may need this after age 28. Pneumococcal 13-valent conjugate (PCV13) vaccine. One dose is recommended after age 20. Pneumococcal polysaccharide (PPSV23) vaccine. One dose is recommended after age 94. Talk to your health care provider about which screenings and vaccines you need and how often you need them. This information is not intended to replace advice given to you by your health care provider. Make sure you discuss any questions you have with your health care provider. Document Released: 10/29/2015 Document Revised: 06/21/2016 Document Reviewed: 08/03/2015 Elsevier Interactive Patient Education  2017 [place] Prevention in the Home Falls can cause injuries. They can happen to people of all ages. There are many things you can do to make your home safe and to help prevent falls. What can I do on the outside of my home? Regularly fix the edges of walkways and driveways and fix any cracks. Remove anything that might make you trip as you walk through a door, such as a raised step or threshold. Trim any bushes or trees on the path to your home. Use bright outdoor lighting. Clear any walking paths of anything that might make someone trip, such as rocks or tools. Regularly check to see if handrails are loose or broken. Make sure that both sides of any steps have handrails. Any raised decks and porches should have guardrails on the edges. Have any leaves, snow, or ice cleared regularly. Use sand or salt on walking paths during winter. Clean up any spills in your garage right away. This includes oil or grease spills. What  can I do in the bathroom? Use night lights. Install grab bars by the toilet and in the tub and shower. Do not use towel bars as grab bars. Use non-skid mats or decals in the tub or shower. If you need to sit down in the shower, use a plastic, non-slip stool. Keep the floor dry. Clean up any water that spills on the floor as soon as it happens. Remove soap buildup in the tub or shower regularly. Attach bath mats securely with double-sided non-slip rug tape. Do not have throw rugs and other things on the floor that can make you trip. What can I do in the bedroom? Use night lights. Make sure that you have a light by your bed that is easy to reach. Do not use any sheets or blankets that are too big for your bed. They should not hang down onto the floor. Have a firm chair that has side arms. You can use this for support while you get dressed. Do not have throw rugs and other things on the floor that can make you trip. What can I do in the kitchen? Clean up any spills right away. Avoid walking on wet  floors. Keep items that you use a lot in easy-to-reach places. If you need to reach something above you, use a strong step stool that has a grab bar. Keep electrical cords out of the way. Do not use floor polish or wax that makes floors slippery. If you must use wax, use non-skid floor wax. Do not have throw rugs and other things on the floor that can make you trip. What can I do with my stairs? Do not leave any items on the stairs. Make sure that there are handrails on both sides of the stairs and use them. Fix handrails that are broken or loose. Make sure that handrails are as long as the stairways. Check any carpeting to make sure that it is firmly attached to the stairs. Fix any carpet that is loose or worn. Avoid having throw rugs at the top or bottom of the stairs. If you do have throw rugs, attach them to the floor with carpet tape. Make sure that you have a light switch at the top of the  stairs and the bottom of the stairs. If you do not have them, ask someone to add them for you. What else can I do to help prevent falls? Wear shoes that: Do not have high heels. Have rubber bottoms. Are comfortable and fit you well. Are closed at the toe. Do not wear sandals. If you use a stepladder: Make sure that it is fully opened. Do not climb a closed stepladder. Make sure that both sides of the stepladder are locked into place. Ask someone to hold it for you, if possible. Clearly mark and make sure that you can see: Any grab bars or handrails. First and last steps. Where the edge of each step is. Use tools that help you move around (mobility aids) if they are needed. These include: Canes. Walkers. Scooters. Crutches. Turn on the lights when you go into a dark area. Replace any light bulbs as soon as they burn out. Set up your furniture so you have a clear path. Avoid moving your furniture around. If any of your floors are uneven, fix them. If there are any pets around you, be aware of where they are. Review your medicines with your doctor. Some medicines can make you feel dizzy. This can increase your chance of falling. Ask your doctor what other things that you can do to help prevent falls. This information is not intended to replace advice given to you by your health care provider. Make sure you discuss any questions you have with your health care provider. Document Released: 07/29/2009 Document Revised: 03/09/2016 Document Reviewed: 11/06/2014 Elsevier Interactive Patient Education  2017 [employer].

## 2023-02-02 NOTE — Progress Notes (Signed)
Subjective:   Dakota Cooke is a 67 y.o. male who presents for Medicare Annual/Subsequent preventive examination. I connected with  Lenise Herald on 02/02/23 by a audio enabled telemedicine application and verified that I am speaking with the correct person using two identifiers.  Patient Location: Home  Provider Location: Home Office  I discussed the limitations of evaluation and management by telemedicine. The patient expressed understanding and agreed to proceed.  Review of Systems     Cardiac Risk Factors include: advanced age (>37men, >12 women);male gender     Objective:    Today's Vitals   02/02/23 0820  Weight: 210 lb (95.3 kg)  Height:  (1.727 m)   Body mass index is 31.93 kg/m.     02/02/2023    8:26 AM 01/31/2022    8:32 AM 01/19/2021    8:31 AM 11/29/2019    4:00 PM 11/29/2019    6:51 AM  Advanced Directives  Does Patient Have a Medical Advance Directive? No No No No No  Would patient like information on creating a medical advance directive? No - Patient declined No - Patient declined No - Patient declined No - Patient declined No - Patient declined    Current Medications (verified) Outpatient Encounter Medications as of 02/02/2023  Medication Sig   aspirin 81 MG EC tablet Take 81 mg by mouth daily. Swallow whole.   atorvastatin (LIPITOR) 10 MG tablet One tablet daily   losartan (COZAAR) 100 MG tablet Take 1 tablet (100 mg total) by mouth daily.   No facility-administered encounter medications on file as of 02/02/2023.    Allergies (verified) Patient has no known allergies.   History: Past Medical History:  Diagnosis Date   BPH (benign prostatic hyperplasia)    Hyperlipidemia 10/15/2019   Hypertension    Personal history of COVID-19 11/2019   Past Surgical History:  Procedure Laterality Date   BACK SURGERY  1991   lower thoracic spine fusion to L3 level   COLONOSCOPY  2015   multiple TA's   POLYPECTOMY  2015   multiple TA's   Family  History  Problem Relation Age of Onset   Hypertension Mother    Kidney cancer Mother    Colon cancer Mother        passed at age 38   Kidney disease Mother        45 when passed from kidney cancer   Parkinson's disease Father    Breast cancer Sister 19   Aneurysm Maternal Grandmother        brain   Hypertension Son    Colon cancer Maternal Uncle 54   Colon polyps Neg Hx    Rectal cancer Neg Hx    Esophageal cancer Neg Hx    Stomach cancer Neg Hx    Social History   Socioeconomic History   Marital status: Married    Spouse name: Not on file   Number of children: 3   Years of education: Not on file   Highest education level: Not on file  Occupational History   Occupation: retired  Tobacco Use   Smoking status: Former    Types: Cigarettes    Quit date: 1989    Years since quitting: 35.3   Smokeless tobacco: Never  Vaping Use   Vaping Use: Never used  Substance and Sexual Activity   Alcohol use: Yes    Alcohol/week: 1.0 - 2.0 standard drink of alcohol    Types: 1 - 2 Cans of beer per week  Comment: occassionally   Drug use: Never   Sexual activity: Not on file  Other Topics Concern   Not on file  Social History Narrative   Children live out of town   He lives in one level home with wife and cares for her as she is disabled.    Social Determinants of Health   Financial Resource Strain: Low Risk  (02/02/2023)   Overall Financial Resource Strain (CARDIA)    Difficulty of Paying Living Expenses: Not hard at all  Food Insecurity: No Food Insecurity (02/02/2023)   Hunger Vital Sign    Worried About Running Out of Food in the Last Year: Never true    Ran Out of Food in the Last Year: Never true  Transportation Needs: No Transportation Needs (02/02/2023)   PRAPARE - Administrator, Civil Service (Medical): No    Lack of Transportation (Non-Medical): No  Physical Activity: Insufficiently Active (02/02/2023)   Exercise Vital Sign    Days of Exercise per  Week: 3 days    Minutes of Exercise per Session: 30 min  Stress: No Stress Concern Present (02/02/2023)   [employer] of Occupational Health - Occupational Stress Questionnaire    Feeling of Stress : Not at all  Social Connections: Moderately Isolated (02/02/2023)   Social Connection and Isolation Panel [NHANES]    Frequency of Communication with Friends and Family: More than three times a week    Frequency of Social Gatherings with Friends and Family: More than three times a week    Attends Religious Services: Never    Database administrator or Organizations: No    Attends Engineer, structural: Never    Marital Status: Married    Tobacco Counseling Counseling given: Not Answered   Clinical Intake:  Pre-visit preparation completed: Yes  Pain : No/denies pain     Nutritional Risks: None Diabetes: No  How often do you need to have someone help you when you read instructions, pamphlets, or other written materials from your doctor or pharmacy?: 1 - Never  Diabetic?no   Interpreter Needed?: No  Information entered by :: Renie Ora, LPN   Activities of Daily Living    02/02/2023    8:26 AM  In your present state of health, do you have any difficulty performing the following activities:  Hearing? 0  Vision? 0  Difficulty concentrating or making decisions? 0  Walking or climbing stairs? 0  Dressing or bathing? 0  Doing errands, shopping? 0  Preparing Food and eating ? N  Using the Toilet? N  In the past six months, have you accidently leaked urine? N  Do you have problems with loss of bowel control? N  Managing your Medications? N  Managing your Finances? N  Housekeeping or managing your Housekeeping? N    Patient Care Team: Mechele Claude, MD as PCP - General (Family Medicine) Armbruster, Willaim Rayas, MD as Consulting Physician (Gastroenterology) Marcine Matar, MD as Consulting Physician (Urology)  Indicate any recent Medical Services you may  have received from other than [facility] providers in the past year (date may be approximate).     Assessment:   This is a routine wellness examination for Dakota Cooke.  Hearing/Vision screen Vision Screening - Comments:: Referral 02/02/2023  Dietary issues and exercise activities discussed: Current Exercise Habits: Home exercise routine, Type of exercise: walking, Time (Minutes): 30, Frequency (Times/Week): 3, Weekly Exercise (Minutes/Week): 90, Intensity: Mild, Exercise limited by: None identified   Goals Addressed  This Visit's Progress    Patient Stated   On track    Stay active and healthy       Depression Screen    02/02/2023    8:25 AM 01/24/2023    8:06 AM 01/31/2022    8:22 AM 01/18/2022    9:06 AM 06/17/2021    9:01 AM 01/19/2021    8:25 AM 12/16/2020    8:36 AM  PHQ 2/9 Scores  PHQ - 2 Score 0 0 0 0 0 0 0  PHQ- 9 Score   1 0 1      Fall Risk    02/02/2023    8:23 AM 01/24/2023    8:06 AM 01/31/2022    8:21 AM 01/18/2022    9:06 AM 06/17/2021    9:01 AM  Fall Risk   Falls in the past year? 0 0 0 0 0  Number falls in past yr: 0  0    Injury with Fall? 0  0    Risk for fall due to : No Fall Risks  No Fall Risks    Follow up Falls prevention discussed  Falls prevention discussed      FALL RISK PREVENTION PERTAINING TO THE HOME:  Any stairs in or around the home? No  If so, are there any without handrails? No  Home free of loose throw rugs in walkways, pet beds, electrical cords, etc? Yes  Adequate lighting in your home to reduce risk of falls? Yes   ASSISTIVE DEVICES UTILIZED TO PREVENT FALLS:  Life alert? No  Use of a cane, walker or w/c? No  Grab bars in the bathroom? Yes  Shower chair or bench in shower? Yes  Elevated toilet seat or a handicapped toilet? No          02/02/2023    8:26 AM 01/31/2022    8:31 AM  6CIT Screen  What Year? 0 points 0 points  What month? 0 points 0 points  What time? 0 points 0 points  Count back from 20 0 points 0 points   Months in reverse 0 points 2 points  Repeat phrase 0 points 0 points  Total Score 0 points 2 points    Immunizations Immunization History  Administered Date(s) Administered   Moderna Sars-Covid-2 Vaccination 03/17/2020, 04/14/2020, 10/20/2020   Tdap 10/14/2011, 01/23/2013    TDAP status: Up to date  Flu Vaccine status: Declined, Education has been provided regarding the importance of this vaccine but patient still declined. Advised may receive this vaccine at local pharmacy or Health Dept. Aware to provide a copy of the vaccination record if obtained from local pharmacy or Health Dept. Verbalized acceptance and understanding.  Pneumococcal vaccine status: Declined,  Education has been provided regarding the importance of this vaccine but patient still declined. Advised may receive this vaccine at local pharmacy or Health Dept. Aware to provide a copy of the vaccination record if obtained from local pharmacy or Health Dept. Verbalized acceptance and understanding.   Covid-19 vaccine status: Completed vaccines  Qualifies for Shingles Vaccine? Yes   Zostavax completed No   Shingrix Completed?: No.    Education has been provided regarding the importance of this vaccine. Patient has been advised to call insurance company to determine out of pocket expense if they have not yet received this vaccine. Advised may also receive vaccine at local pharmacy or Health Dept. Verbalized acceptance and understanding.  Screening Tests Health Maintenance  Topic Date Due   DTaP/Tdap/Td (3 - Td or  Tdap) 01/24/2023   COVID-19 Vaccine (4 - 2023-24 season) 02/09/2023 (Originally 06/16/2022)   Zoster Vaccines- Shingrix (1 of 2) 04/25/2023 (Originally 03/13/1975)   Pneumonia Vaccine 24+ Years old (1 of 1 - PCV) 01/24/2024 (Originally 03/14/2019)   INFLUENZA VACCINE  05/17/2023   COLONOSCOPY (Pts 45-31yrs Insurance coverage will need to be confirmed)  06/04/2023   Medicare Annual Wellness (AWV)  02/02/2024    Hepatitis C Screening  Completed   HPV VACCINES  Aged Out    Health Maintenance  Health Maintenance Due  Topic Date Due   DTaP/Tdap/Td (3 - Td or Tdap) 01/24/2023    Colorectal cancer screening: Type of screening: Colonoscopy. Completed 06/03/2020. Repeat every 3 years  Lung Cancer Screening: (Low Dose CT Chest recommended if Age 12-80 years, 30 pack-year currently smoking OR have quit w/in 15years.) does not qualify.   Lung Cancer Screening Referral: n/a  Additional Screening:  Hepatitis C Screening: does not qualify; Completed 01/09/2020  Vision Screening: Recommended annual ophthalmology exams for early detection of glaucoma and other disorders of the eye. Is the patient up to date with their annual eye exam?  No  Who is the provider or what is the name of the office in which the patient attends annual eye exams? None referral 02/02/2023 If pt is not established with a provider, would they like to be referred to a provider to establish care? No .   Dental Screening: Recommended annual dental exams for proper oral hygiene  Community Resource Referral / Chronic Care Management: CRR required this visit?  No   CCM required this visit?  No      Plan:     I have personally reviewed and noted the following in the patient's chart:   Medical and social history Use of alcohol, tobacco or illicit drugs  Current medications and supplements including opioid prescriptions. Patient is not currently taking opioid prescriptions. Functional ability and status Nutritional status Physical activity Advanced directives List of other physicians Hospitalizations, surgeries, and ER visits in previous 12 months Vitals Screenings to include cognitive, depression, and falls Referrals and appointments  In addition, I have reviewed and discussed with patient certain preventive protocols, quality metrics, and best practice recommendations. A written personalized care plan for preventive  services as well as general preventive health recommendations were provided to patient.     Lorrene Reid, LPN   1/61/0960   Nurse Notes: none

## 2023-04-12 DIAGNOSIS — M1990 Unspecified osteoarthritis, unspecified site: Secondary | ICD-10-CM | POA: Diagnosis not present

## 2023-04-12 DIAGNOSIS — Z008 Encounter for other general examination: Secondary | ICD-10-CM | POA: Diagnosis not present

## 2023-04-12 DIAGNOSIS — E669 Obesity, unspecified: Secondary | ICD-10-CM | POA: Diagnosis not present

## 2023-04-12 DIAGNOSIS — E785 Hyperlipidemia, unspecified: Secondary | ICD-10-CM | POA: Diagnosis not present

## 2023-04-12 DIAGNOSIS — Z8249 Family history of ischemic heart disease and other diseases of the circulatory system: Secondary | ICD-10-CM | POA: Diagnosis not present

## 2023-04-12 DIAGNOSIS — N182 Chronic kidney disease, stage 2 (mild): Secondary | ICD-10-CM | POA: Diagnosis not present

## 2023-04-12 DIAGNOSIS — I129 Hypertensive chronic kidney disease with stage 1 through stage 4 chronic kidney disease, or unspecified chronic kidney disease: Secondary | ICD-10-CM | POA: Diagnosis not present

## 2023-04-12 DIAGNOSIS — Z8051 Family history of malignant neoplasm of kidney: Secondary | ICD-10-CM | POA: Diagnosis not present

## 2023-04-12 DIAGNOSIS — Z6831 Body mass index (BMI) 31.0-31.9, adult: Secondary | ICD-10-CM | POA: Diagnosis not present

## 2023-04-12 DIAGNOSIS — Z820 Family history of epilepsy and other diseases of the nervous system: Secondary | ICD-10-CM | POA: Diagnosis not present

## 2023-04-12 DIAGNOSIS — J302 Other seasonal allergic rhinitis: Secondary | ICD-10-CM | POA: Diagnosis not present

## 2023-04-20 ENCOUNTER — Encounter: Payer: Self-pay | Admitting: Gastroenterology

## 2024-01-29 ENCOUNTER — Encounter: Payer: Self-pay | Admitting: Family Medicine

## 2024-01-29 ENCOUNTER — Ambulatory Visit (INDEPENDENT_AMBULATORY_CARE_PROVIDER_SITE_OTHER): Payer: Medicare HMO | Admitting: Family Medicine

## 2024-01-29 VITALS — BP 113/67 | HR 70 | Temp 97.2°F | Ht 68.0 in | Wt 208.0 lb

## 2024-01-29 DIAGNOSIS — E782 Mixed hyperlipidemia: Secondary | ICD-10-CM

## 2024-01-29 DIAGNOSIS — R350 Frequency of micturition: Secondary | ICD-10-CM | POA: Diagnosis not present

## 2024-01-29 DIAGNOSIS — Z23 Encounter for immunization: Secondary | ICD-10-CM

## 2024-01-29 DIAGNOSIS — N401 Enlarged prostate with lower urinary tract symptoms: Secondary | ICD-10-CM | POA: Diagnosis not present

## 2024-01-29 DIAGNOSIS — I10 Essential (primary) hypertension: Secondary | ICD-10-CM | POA: Diagnosis not present

## 2024-01-29 DIAGNOSIS — N3281 Overactive bladder: Secondary | ICD-10-CM | POA: Diagnosis not present

## 2024-01-29 DIAGNOSIS — Z0001 Encounter for general adult medical examination with abnormal findings: Secondary | ICD-10-CM | POA: Diagnosis not present

## 2024-01-29 DIAGNOSIS — Z0000 Encounter for general adult medical examination without abnormal findings: Secondary | ICD-10-CM

## 2024-01-29 DIAGNOSIS — Z125 Encounter for screening for malignant neoplasm of prostate: Secondary | ICD-10-CM

## 2024-01-29 NOTE — Progress Notes (Signed)
 Subjective:  Patient ID: Dakota Cooke, male    DOB: [birth year]  Age: 68 y.o. MRN: [id number]  CC: Annual Exam (No concerns at this time. )   HPI Dakota Cooke presents for Annual exam     01/29/2024    8:05 AM 02/02/2023    8:25 AM 01/24/2023    8:06 AM  Depression screen PHQ 2/9  Decreased Interest 0 0 0  Down, Depressed, Hopeless 0 0 0  PHQ - 2 Score 0 0 0  Altered sleeping 0    Tired, decreased energy 0    Change in appetite 0    Feeling bad or failure about yourself  0    Trouble concentrating 0    Moving slowly or fidgety/restless 0    Suicidal thoughts 0    PHQ-9 Score 0    Difficult doing work/chores Not difficult at all      History Dakota Cooke has a past medical history of BPH (benign prostatic hyperplasia), Hyperlipidemia (10/15/2019), Hypertension, and Personal history of COVID-19 (11/2019).   He has a past surgical history that includes Back surgery (1991); Colonoscopy (2015); and Polypectomy (2015).   His family history includes Aneurysm in his maternal grandmother; Breast cancer (age of onset: 18) in his sister; Colon cancer in his mother; Colon cancer (age of onset: 69) in his maternal uncle; Hypertension in his mother and son; Kidney cancer in his mother; Kidney disease in his mother; Parkinson's disease in his father.He reports that he quit smoking about 36 years ago. His smoking use included cigarettes. He has never used smokeless tobacco. He reports current alcohol use of about 1.0 - 2.0 standard drink of alcohol per week. He reports that he does not use drugs.    ROS Review of Systems  Constitutional:  Negative for activity change, fatigue and unexpected weight change.  HENT:  Negative for congestion, ear pain, hearing loss, postnasal drip and trouble swallowing.   Eyes:  Negative for pain and visual disturbance.  Respiratory:  Negative for cough, chest tightness and shortness of breath.   Cardiovascular:  Negative for chest pain, palpitations and leg  swelling.  Gastrointestinal:  Negative for abdominal distention, abdominal pain, blood in stool, constipation, diarrhea, nausea and vomiting.  Endocrine: Negative for cold intolerance, heat intolerance and polydipsia.  Genitourinary:  Negative for difficulty urinating, dysuria, flank pain, frequency and urgency.  Musculoskeletal:  Positive for arthralgias (knees, hips stiff through the day). Negative for joint swelling.  Skin:  Negative for color change, rash and wound.  Neurological:  Negative for dizziness, syncope, speech difficulty, weakness, light-headedness, numbness and headaches.  Hematological:  Does not bruise/bleed easily.  Psychiatric/Behavioral:  Negative for confusion, decreased concentration, dysphoric mood and sleep disturbance. The patient is not nervous/anxious.     Objective:  BP 113/67   Pulse 70   Temp (!) 97.2 F (36.2 C)   Ht 5\' 8"  (1.727 m)   Wt 208 lb (94.3 kg)   SpO2 95%   BMI 31.63 kg/m   BP Readings from Last 3 Encounters:  01/29/24 113/67  01/24/23 135/83  01/18/22 131/78    Wt Readings from Last 3 Encounters:  01/29/24 208 lb (94.3 kg)  02/02/23 210 lb (95.3 kg)  01/24/23 209 lb 12.8 oz (95.2 kg)     Physical Exam Constitutional:      Appearance: He is well-developed.  HENT:     Head: Normocephalic and atraumatic.  Eyes:     Pupils: Pupils are equal, round, and reactive to light.  Neck:     Thyroid: No thyromegaly.     Trachea: No tracheal deviation.  Cardiovascular:     Rate and Rhythm: Normal rate and regular rhythm.     Heart sounds: Normal heart sounds. No murmur heard.    No friction rub. No gallop.  Pulmonary:     Breath sounds: Normal breath sounds. No wheezing or rales.  Abdominal:     General: Bowel sounds are normal. There is no distension.     Palpations: Abdomen is soft. There is no mass.     Tenderness: There is no abdominal tenderness.     Hernia: There is no hernia in the left inguinal area.  Genitourinary:     Penis: Normal.      Testes: Normal.  Musculoskeletal:        General: Normal range of motion.     Cervical back: Normal range of motion.  Lymphadenopathy:     Cervical: No cervical adenopathy.  Skin:    General: Skin is warm and dry.  Neurological:     Mental Status: He is alert and oriented to person, place, and time.      Assessment & Plan:  Well adult exam -     CBC with Differential/Platelet -     Lipid panel -     CMP14+EGFR -     Urinalysis -     PSA, total and free  Mixed hyperlipidemia -     CBC with Differential/Platelet -     Lipid panel -     CMP14+EGFR  Essential hypertension -     CBC with Differential/Platelet -     Lipid panel -     CMP14+EGFR  Benign prostatic hyperplasia with urinary frequency -     Urinalysis -     PSA, total and free  Overactive bladder -     Urinalysis -     PSA, total and free  Immunization due -     Tdap vaccine greater than or equal to 7yo IM     Follow-up: Return in about 1 year (around 01/28/2025) for [facility] physical.  Roise Cleaver, M.D.

## 2024-01-30 ENCOUNTER — Other Ambulatory Visit

## 2024-01-30 DIAGNOSIS — I10 Essential (primary) hypertension: Secondary | ICD-10-CM | POA: Diagnosis not present

## 2024-01-30 DIAGNOSIS — N401 Enlarged prostate with lower urinary tract symptoms: Secondary | ICD-10-CM | POA: Diagnosis not present

## 2024-01-30 DIAGNOSIS — E782 Mixed hyperlipidemia: Secondary | ICD-10-CM | POA: Diagnosis not present

## 2024-01-30 DIAGNOSIS — R350 Frequency of micturition: Secondary | ICD-10-CM | POA: Diagnosis not present

## 2024-01-30 DIAGNOSIS — Z0000 Encounter for general adult medical examination without abnormal findings: Secondary | ICD-10-CM | POA: Diagnosis not present

## 2024-01-30 DIAGNOSIS — N3281 Overactive bladder: Secondary | ICD-10-CM | POA: Diagnosis not present

## 2024-01-30 LAB — URINALYSIS
Bilirubin, UA: NEGATIVE
Glucose, UA: NEGATIVE
Ketones, UA: NEGATIVE
Nitrite, UA: NEGATIVE
Protein,UA: NEGATIVE
RBC, UA: NEGATIVE
Specific Gravity, UA: >1.03 — ABNORMAL HIGH (ref 1.005–1.030)
Urobilinogen, Ur: 0.2 mg/dL (ref 0.2–1.0)
pH, UA: 5.5 (ref 5.0–7.5)

## 2024-01-30 LAB — LIPID PANEL

## 2024-01-31 LAB — CBC WITH DIFFERENTIAL/PLATELET
Basophils Absolute: 0 10*3/uL (ref 0.0–0.2)
Basos: 1 %
EOS (ABSOLUTE): 0.2 10*3/uL (ref 0.0–0.4)
Eos: 2 %
Hematocrit: 49.7 % (ref 37.5–51.0)
Hemoglobin: 16.3 g/dL (ref 13.0–17.7)
Immature Grans (Abs): 0 10*3/uL (ref 0.0–0.1)
Immature Granulocytes: 0 %
Lymphocytes Absolute: 3.2 10*3/uL — ABNORMAL HIGH (ref 0.7–3.1)
Lymphs: 39 %
MCH: 30.4 pg (ref 26.6–33.0)
MCHC: 32.8 g/dL (ref 31.5–35.7)
MCV: 93 fL (ref 79–97)
Monocytes Absolute: 0.8 10*3/uL (ref 0.1–0.9)
Monocytes: 10 %
Neutrophils Absolute: 3.9 10*3/uL (ref 1.4–7.0)
Neutrophils: 48 %
Platelets: 180 10*3/uL (ref 150–450)
RBC: 5.37 x10E6/uL (ref 4.14–5.80)
RDW: 13.2 % (ref 11.6–15.4)
WBC: 8.2 10*3/uL (ref 3.4–10.8)

## 2024-01-31 LAB — LIPID PANEL
Cholesterol, Total: 135 mg/dL (ref 100–199)
HDL: 31 mg/dL — ABNORMAL LOW (ref >39)
LDL CALC COMMENT:: 4.4 ratio (ref 0.0–5.0)
LDL Chol Calc (NIH): 78 mg/dL (ref 0–99)
Triglycerides: 149 mg/dL (ref 0–149)
VLDL Cholesterol Cal: 26 mg/dL (ref 5–40)

## 2024-01-31 LAB — CMP14+EGFR
ALT: 27 IU/L (ref 0–44)
AST: 25 IU/L (ref 0–40)
Albumin: 4.7 g/dL (ref 3.9–4.9)
Alkaline Phosphatase: 78 IU/L (ref 44–121)
BUN/Creatinine Ratio: 18 (ref 10–24)
BUN: 17 mg/dL (ref 8–27)
Bilirubin Total: 0.6 mg/dL (ref 0.0–1.2)
CO2: 23 mmol/L (ref 20–29)
Calcium: 9.5 mg/dL (ref 8.6–10.2)
Chloride: 106 mmol/L (ref 96–106)
Creatinine, Ser: 0.93 mg/dL (ref 0.76–1.27)
Globulin, Total: 2.2 g/dL (ref 1.5–4.5)
Glucose: 100 mg/dL — ABNORMAL HIGH (ref 70–99)
Potassium: 4.7 mmol/L (ref 3.5–5.2)
Sodium: 143 mmol/L (ref 134–144)
Total Protein: 6.9 g/dL (ref 6.0–8.5)
eGFR: 89 mL/min/{1.73_m2} (ref >59)

## 2024-01-31 LAB — PSA, TOTAL AND FREE
PSA, Free Pct: 26.5 %
PSA, Free: 0.45 ng/mL
Prostate Specific Ag, Serum: 1.7 ng/mL (ref 0.0–4.0)

## 2024-02-06 ENCOUNTER — Ambulatory Visit: Payer: Medicare HMO

## 2024-02-06 VITALS — BP 113/67 | HR 95 | Ht 68.0 in | Wt 208.0 lb

## 2024-02-06 DIAGNOSIS — Z0000 Encounter for general adult medical examination without abnormal findings: Secondary | ICD-10-CM | POA: Diagnosis not present

## 2024-02-06 DIAGNOSIS — Z1211 Encounter for screening for malignant neoplasm of colon: Secondary | ICD-10-CM

## 2024-02-06 NOTE — Progress Notes (Signed)
 Subjective:   Dakota Cooke is a 68 y.o. who presents for a Medicare Wellness preventive visit.  Visit Complete: Virtual I connected with  Dakota Cooke on 02/06/24 by a audio enabled telemedicine application and verified that I am speaking with the correct person using two identifiers.  Patient Location: Home  Provider Location: Home Office  I discussed the limitations of evaluation and management by telemedicine. The patient expressed understanding and agreed to proceed.  Vital Signs: Because this visit was a virtual/telehealth visit, some criteria may be missing or patient reported. Any vitals not documented were not able to be obtained and vitals that have been documented are patient reported.  VideoDeclined- This patient declined Librarian, academic. Therefore the visit was completed with audio only.  Persons Participating in Visit: Patient.  AWV Questionnaire: No: Patient Medicare AWV questionnaire was not completed prior to this visit.  Cardiac Risk Factors include: advanced age (>19men, >45 women);male gender;obesity (BMI >30kg/m2);dyslipidemia;hypertension     Objective:    Today's Vitals   02/06/24 0857  BP: 113/67  Pulse: 95  Weight: 208 lb (94.3 kg)  Height: 5\' 8"  (1.727 m)   Body mass index is 31.63 kg/m.     02/06/2024    8:59 AM 02/02/2023    8:26 AM 01/31/2022    8:32 AM 01/19/2021    8:31 AM 11/29/2019    4:00 PM 11/29/2019    6:51 AM  Advanced Directives  Does Patient Have a Medical Advance Directive? No No No No No No  Would patient like information on creating a medical advance directive?  No - Patient declined No - Patient declined No - Patient declined No - Patient declined No - Patient declined    Current Medications (verified) Outpatient Encounter Medications as of 02/06/2024  Medication Sig   aspirin  81 MG EC tablet Take 81 mg by mouth daily. Swallow whole.   atorvastatin  (LIPITOR) 10 MG tablet One tablet daily    losartan  (COZAAR ) 100 MG tablet Take 1 tablet (100 mg total) by mouth daily.   No facility-administered encounter medications on file as of 02/06/2024.    Allergies (verified) Patient has no known allergies.   History: Past Medical History:  Diagnosis Date   BPH (benign prostatic hyperplasia)    Hyperlipidemia 10/15/2019   Hypertension    Personal history of COVID-19 11/2019   Past Surgical History:  Procedure Laterality Date   BACK SURGERY  1991   lower thoracic spine fusion to L3 level   COLONOSCOPY  2015   multiple TA's   POLYPECTOMY  2015   multiple TA's   Family History  Problem Relation Age of Onset   Hypertension Mother    Kidney cancer Mother    Colon cancer Mother        passed at age 105   Kidney disease Mother        68 when passed from kidney cancer   Parkinson's disease Father    Breast cancer Sister 83   Aneurysm Maternal Grandmother        brain   Hypertension Son    Colon cancer Maternal Uncle 64   Colon polyps Neg Hx    Rectal cancer Neg Hx    Esophageal cancer Neg Hx    Stomach cancer Neg Hx    Social History   Socioeconomic History   Marital status: Married    Spouse name: Not on file   Number of children: 3   Years of education: Not on  file   Highest education level: Not on file  Occupational History   Occupation: retired  Tobacco Use   Smoking status: Former    Current packs/day: 0.00    Types: Cigarettes    Quit date: 1989    Years since quitting: 36.3   Smokeless tobacco: Never  Vaping Use   Vaping status: Never Used  Substance and Sexual Activity   Alcohol use: Yes    Alcohol/week: 1.0 - 2.0 standard drink of alcohol    Types: 1 - 2 Cans of beer per week    Comment: occassionally   Drug use: Never   Sexual activity: Not on file  Other Topics Concern   Not on file  Social History Narrative   Children live out of town   He lives in one level home with wife and cares for her as she is disabled.    Social Drivers of Manufacturing engineer Strain: Low Risk  (02/06/2024)   Overall Financial Resource Strain (CARDIA)    Difficulty of Paying Living Expenses: Not hard at all  Food Insecurity: No Food Insecurity (02/06/2024)   Hunger Vital Sign    Worried About Running Out of Food in the Last Year: Never true    Ran Out of Food in the Last Year: Never true  Transportation Needs: No Transportation Needs (02/06/2024)   PRAPARE - Administrator, Civil Service (Medical): No    Lack of Transportation (Non-Medical): No  Physical Activity: Sufficiently Active (02/06/2024)   Exercise Vital Sign    Days of Exercise per Week: 7 days    Minutes of Exercise per Session: 30 min  Stress: No Stress Concern Present (02/06/2024)   [employer] of Occupational Health - Occupational Stress Questionnaire    Feeling of Stress : Not at all  Social Connections: Socially Isolated (02/06/2024)   Social Connection and Isolation Panel [NHANES]    Frequency of Communication with Friends and Family: Once a week    Frequency of Social Gatherings with Friends and Family: Once a week    Attends Religious Services: Never    Database administrator or Organizations: No    Attends Engineer, structural: Never    Marital Status: Married    Tobacco Counseling Counseling given: Yes    Clinical Intake:  Pre-visit preparation completed: Yes  Pain : No/denies pain     BMI - recorded: 31.36 Nutritional Status: BMI > 30  Obese Nutritional Risks: None Diabetes: No  Lab Results  Component Value Date   HGBA1C 5.7 10/14/2019     How often do you need to have someone help you when you read instructions, pamphlets, or other written materials from your doctor or pharmacy?: 1 - Never  Interpreter Needed?: No  Information entered by :: alia t/cma   Activities of Daily Living     02/06/2024    8:43 AM  In your present state of health, do you have any difficulty performing the following activities:  Hearing? 1   Vision? 0  Difficulty concentrating or making decisions? 0  Walking or climbing stairs? 0  Dressing or bathing? 0  Doing errands, shopping? 0  Preparing Food and eating ? N  Using the Toilet? N  In the past six months, have you accidently leaked urine? N  Do you have problems with loss of bowel control? N  Managing your Medications? N  Managing your Finances? N  Housekeeping or managing your Housekeeping? N  Patient Care Team: Roise Cleaver, MD as PCP - General (Family Medicine) Armbruster, Lendon Queen, MD as Consulting Physician (Gastroenterology) Trent Frizzle, MD as Consulting Physician (Urology)  Indicate any recent Medical Services you may have received from other than [facility] providers in the past year (date may be approximate).     Assessment:   This is a routine wellness examination for Dakota Cooke.  Hearing/Vision screen Hearing Screening - Comments:: Pt stated some loss of hearing R-hearing loss will send msg to pcp for refer Vision Screening - Comments:: Pt denies vision, pt needs new glasses    Goals Addressed             This Visit's Progress    Patient Stated   On track    Stay active and healthy       Depression Screen     02/06/2024    8:48 AM 01/29/2024    8:05 AM 02/02/2023    8:25 AM 01/24/2023    8:06 AM 01/31/2022    8:22 AM 01/18/2022    9:06 AM 06/17/2021    9:01 AM  PHQ 2/9 Scores  PHQ - 2 Score 0 0 0 0 0 0 0  PHQ- 9 Score 0 0   1 0 1    Fall Risk     02/06/2024    8:42 AM 02/02/2023    8:23 AM 01/24/2023    8:06 AM 01/31/2022    8:21 AM 01/18/2022    9:06 AM  Fall Risk   Falls in the past year? 0 0 0 0 0  Number falls in past yr: 0 0  0   Injury with Fall? 0 0  0   Risk for fall due to : No Fall Risks No Fall Risks  No Fall Risks   Follow up Falls prevention discussed;Falls evaluation completed Falls prevention discussed  Falls prevention discussed     MEDICARE RISK AT HOME:  Medicare Risk at Home Any stairs in or around the home?:  Yes If so, are there any without handrails?: Yes Home free of loose throw rugs in walkways, pet beds, electrical cords, etc?: Yes Adequate lighting in your home to reduce risk of falls?: Yes Life alert?: No Use of a cane, walker or w/c?: No Grab bars in the bathroom?: Yes Shower chair or bench in shower?: Yes Elevated toilet seat or a handicapped toilet?: Yes  TIMED UP AND GO:  Was the test performed?  no  Cognitive Function: 6CIT completed        02/06/2024    8:51 AM 02/02/2023    8:26 AM 01/31/2022    8:31 AM  6CIT Screen  What Year? 0 points 0 points 0 points  What month? 0 points 0 points 0 points  What time? 0 points 0 points 0 points  Count back from 20 0 points 0 points 0 points  Months in reverse 0 points 0 points 2 points  Repeat phrase 0 points 0 points 0 points  Total Score 0 points 0 points 2 points    Immunizations Immunization History  Administered Date(s) Administered   Moderna Sars-Covid-2 Vaccination 03/17/2020, 04/14/2020, 10/20/2020   Tdap 10/14/2011, 01/23/2013, 01/29/2024    Screening Tests Health Maintenance  Topic Date Due   Zoster Vaccines- Shingrix (1 of 2) Never done   COVID-19 Vaccine (4 - 2024-25 season) 02/14/2024 (Originally 06/17/2023)   Pneumonia Vaccine 2+ Years old (1 of 1 - PCV) 01/28/2025 (Originally 03/13/2004)   Colonoscopy  01/28/2025 (Originally 06/04/2023)  INFLUENZA VACCINE  05/16/2024   Medicare Annual Wellness (AWV)  02/05/2025   DTaP/Tdap/Td (4 - Td or Tdap) 01/28/2034   Hepatitis C Screening  Completed   HPV VACCINES  Aged Out   Meningococcal B Vaccine  Aged Out    Health Maintenance  Health Maintenance Due  Topic Date Due   Zoster Vaccines- Shingrix (1 of 2) Never done   Health Maintenance Items Addressed: See Nurse Notes  Additional Screening:  Vision Screening: Recommended annual ophthalmology exams for early detection of glaucoma and other disorders of the eye.  Dental Screening: Recommended annual dental  exams for proper oral hygiene  Community Resource Referral / Chronic Care Management: CRR required this visit?  No   CCM required this visit?  No     Plan:     I have personally reviewed and noted the following in the patient's chart:   Medical and social history Use of alcohol, tobacco or illicit drugs  Current medications and supplements including opioid prescriptions. Patient is not currently taking opioid prescriptions. Functional ability and status Nutritional status Physical activity Advanced directives List of other physicians Hospitalizations, surgeries, and ER visits in previous 12 months Vitals Screenings to include cognitive, depression, and falls Referrals and appointments  In addition, I have reviewed and discussed with patient certain preventive protocols, quality metrics, and best practice recommendations. A written personalized care plan for preventive services as well as general preventive health recommendations were provided to patient.     Dakota Cooke, CMA   02/06/2024   After Visit Summary: (Declined) Due to this being a telephonic visit, with patients personalized plan was offered to patient but patient Declined AVS at this time   Notes: Please refer to Routing Comments.

## 2024-02-06 NOTE — Patient Instructions (Signed)
 Dakota Cooke , Thank you for taking time to come for your Medicare Wellness Visit. I appreciate your ongoing commitment to your health goals. Please review the following plan we discussed and let me know if I can assist you in the future.   Referrals/Orders/Follow-Ups/Clinician Recommendations: Remember to schedule your appointment to have your eyes checked.   This is a list of the screening recommended for you and due dates:  Health Maintenance  Topic Date Due   Zoster (Shingles) Vaccine (1 of 2) Never done   COVID-19 Vaccine (4 - 2024-25 season) 02/14/2024*   Pneumonia Vaccine (1 of 1 - PCV) 01/28/2025*   Colon Cancer Screening  01/28/2025*   Flu Shot  05/16/2024   Medicare Annual Wellness Visit  02/05/2025   DTaP/Tdap/Td vaccine (4 - Td or Tdap) 01/28/2034   Hepatitis C Screening  Completed   HPV Vaccine  Aged Out   Meningitis B Vaccine  Aged Out  *Topic was postponed. The date shown is not the original due date.    Advanced directives: (Declined) Advance directive discussed with you today. Even though you declined this today, please call our office should you change your mind, and we can give you the proper paperwork for you to fill out.  Next Medicare Annual Wellness Visit scheduled for next year: Yes

## 2024-02-17 ENCOUNTER — Other Ambulatory Visit: Payer: Self-pay | Admitting: Family Medicine

## 2024-02-17 DIAGNOSIS — I10 Essential (primary) hypertension: Secondary | ICD-10-CM

## 2024-02-21 ENCOUNTER — Encounter: Payer: Self-pay | Admitting: Gastroenterology

## 2024-03-05 DIAGNOSIS — D225 Melanocytic nevi of trunk: Secondary | ICD-10-CM | POA: Diagnosis not present

## 2024-03-05 DIAGNOSIS — L918 Other hypertrophic disorders of the skin: Secondary | ICD-10-CM | POA: Diagnosis not present

## 2024-03-05 DIAGNOSIS — D485 Neoplasm of uncertain behavior of skin: Secondary | ICD-10-CM | POA: Diagnosis not present

## 2024-03-25 ENCOUNTER — Ambulatory Visit (AMBULATORY_SURGERY_CENTER): Admitting: *Deleted

## 2024-03-25 ENCOUNTER — Telehealth: Payer: Self-pay | Admitting: *Deleted

## 2024-03-25 VITALS — Ht 68.0 in | Wt 210.0 lb

## 2024-03-25 DIAGNOSIS — Z860100 Personal history of colon polyps, unspecified: Secondary | ICD-10-CM

## 2024-03-25 DIAGNOSIS — Z800 Family history of malignant neoplasm of digestive organs: Secondary | ICD-10-CM

## 2024-03-25 MED ORDER — NA SULFATE-K SULFATE-MG SULF 17.5-3.13-1.6 GM/177ML PO SOLN: 1.0000 | Freq: Once | ORAL | 0 refills | Status: AC

## 2024-03-25 NOTE — Progress Notes (Signed)
 Pt's name and DOB verified at the beginning of the pre-visit wit 2 identifiers  Permission given to speak with  Pt denies any difficulty with ambulating,sitting, laying down or rolling side to side  Pt has no issues moving head neck or swallowing  No egg or soy allergy known to patient   No issues known to pt with past sedation with any surgeries or procedures  No FH of Malignant Hyperthermia  Pt is not on home 02   Pt is not on blood thinners   Pt denies issues with constipation   Pt is not on dialysis  Pt denise any abnormal heart rhythms   Pt denies any upcoming cardiac testing  Patient's chart reviewed by Rogena Class CNRA prior to pre-visit and patient appropriate for the [facility].  Pre-visit completed and red dot placed by patient's name on their procedure day (on provider's schedule).     Visit by phone  Pt states weight is 210 lb   IInstructions reviewed. Pt given  both [facility] main # and MD on call # prior to instructions.  Pt states understanding of instructions. Instructed pt to review instructions again prior to procedure and call main # given if has questions.. Pt states they will.   Instructed pt on where to find instructions on My Chart.

## 2024-03-25 NOTE — Telephone Encounter (Signed)
 Attempted 3 times to call pt with # listed in profile. Each time did not ring. Called wifes # listed in profile and gave call back #.  4th attempt to reach pt through his # reached VM. LM with call back # and instructed pt to call bay end of day and reschedule PV or per protocol procedure will be canceled.

## 2024-04-02 ENCOUNTER — Encounter: Payer: Self-pay | Admitting: Gastroenterology

## 2024-04-04 ENCOUNTER — Other Ambulatory Visit: Payer: Self-pay | Admitting: Family Medicine

## 2024-04-04 DIAGNOSIS — E782 Mixed hyperlipidemia: Secondary | ICD-10-CM

## 2024-04-22 ENCOUNTER — Ambulatory Visit: Admitting: Gastroenterology

## 2024-04-22 ENCOUNTER — Encounter: Payer: Self-pay | Admitting: Gastroenterology

## 2024-04-22 VITALS — BP 109/76 | HR 60 | Temp 98.3°F | Resp 16 | Ht 68.0 in | Wt 210.0 lb

## 2024-04-22 DIAGNOSIS — Z860101 Personal history of adenomatous and serrated colon polyps: Secondary | ICD-10-CM | POA: Diagnosis not present

## 2024-04-22 DIAGNOSIS — Z1211 Encounter for screening for malignant neoplasm of colon: Secondary | ICD-10-CM | POA: Diagnosis not present

## 2024-04-22 DIAGNOSIS — Z800 Family history of malignant neoplasm of digestive organs: Secondary | ICD-10-CM | POA: Diagnosis not present

## 2024-04-22 DIAGNOSIS — D122 Benign neoplasm of ascending colon: Secondary | ICD-10-CM | POA: Diagnosis not present

## 2024-04-22 DIAGNOSIS — D123 Benign neoplasm of transverse colon: Secondary | ICD-10-CM

## 2024-04-22 DIAGNOSIS — K648 Other hemorrhoids: Secondary | ICD-10-CM

## 2024-04-22 DIAGNOSIS — D124 Benign neoplasm of descending colon: Secondary | ICD-10-CM

## 2024-04-22 DIAGNOSIS — K5730 Diverticulosis of large intestine without perforation or abscess without bleeding: Secondary | ICD-10-CM

## 2024-04-22 DIAGNOSIS — E785 Hyperlipidemia, unspecified: Secondary | ICD-10-CM | POA: Diagnosis not present

## 2024-04-22 DIAGNOSIS — Z860100 Personal history of colon polyps, unspecified: Secondary | ICD-10-CM

## 2024-04-22 DIAGNOSIS — I10 Essential (primary) hypertension: Secondary | ICD-10-CM | POA: Diagnosis not present

## 2024-04-22 MED ORDER — SODIUM CHLORIDE 0.9 % IV SOLN: 500.0000 mL | Freq: Once | INTRAVENOUS | Status: DC

## 2024-04-22 NOTE — Progress Notes (Signed)
 Pt's states no medical or surgical changes since previsit or office visit.

## 2024-04-22 NOTE — Patient Instructions (Signed)
   Handouts on polyps,diverticulosis,& hemorrhoids given to you today.   Await pathology results on polyps removed   Continue previous diet & medications  YOU HAD AN ENDOSCOPIC PROCEDURE TODAY AT THE [facility]:   Refer to the procedure report that was given to you for any specific questions about what was found during the examination.  If the procedure report does not answer your questions, please call your gastroenterologist to clarify.  If you requested that your care partner not be given the details of your procedure findings, then the procedure report has been included in a sealed envelope for you to review at your convenience later.  YOU SHOULD EXPECT: Some feelings of bloating in the abdomen. Passage of more gas than usual.  Walking can help get rid of the air that was put into your GI tract during the procedure and reduce the bloating. If you had a lower endoscopy (such as a colonoscopy or flexible sigmoidoscopy) you may notice spotting of blood in your stool or on the toilet paper. If you underwent a bowel prep for your procedure, you may not have a normal bowel movement for a few days.  Please Note:  You might notice some irritation and congestion in your nose or some drainage.  This is from the oxygen used during your procedure.  There is no need for concern and it should clear up in a day or so.  SYMPTOMS TO REPORT IMMEDIATELY:  Following lower endoscopy (colonoscopy or flexible sigmoidoscopy):  Excessive amounts of blood in the stool  Significant tenderness or worsening of abdominal pains  Swelling of the abdomen that is new, acute  Fever of 100F or higher    For urgent or emergent issues, a gastroenterologist can be reached at any hour by calling [phone]. Do not use MyChart messaging for urgent concerns.    DIET:  We do recommend a small meal at first, but then you may proceed to your regular diet.  Drink plenty of fluids but you should avoid alcoholic  beverages for 24 hours.  ACTIVITY:  You should plan to take it easy for the rest of today and you should NOT DRIVE or use heavy machinery until tomorrow (because of the sedation medicines used during the test).    FOLLOW UP: Our staff will call the number listed on your records the next business day following your procedure.  We will call around 7:15- 8:00 am to check on you and address any questions or concerns that you may have regarding the information given to you following your procedure. If we do not reach you, we will leave a message.     If any biopsies were taken you will be contacted by phone or by letter within the next 1-3 weeks.  Please call us at [phone] if you have not heard about the biopsies in 3 weeks.    SIGNATURES/CONFIDENTIALITY: You and/or your care partner have signed paperwork which will be entered into your electronic medical record.  These signatures attest to the fact that that the information above on your After Visit Summary has been reviewed and is understood.  Full responsibility of the confidentiality of this discharge information lies with you and/or your care-partner.

## 2024-04-22 NOTE — Progress Notes (Signed)
 Called to room to assist during endoscopic procedure.  Patient ID and intended procedure confirmed with present staff. Received instructions for my participation in the procedure from the performing physician.

## 2024-04-22 NOTE — Op Note (Signed)
 [facility] Patient Name: Dakota Cooke Procedure Date: 04/22/2024 8:57 AM MRN: [id number] Endoscopist: Elspeth P. Leigh , MD, [id number] Age: 68 Referring MD:  Date of Birth: [birth year] Gender: Male Account #: [id number] Procedure:                Colonoscopy Indications:              High risk colon cancer surveillance: Personal                            history of colonic polyps - last exam 2021 -                            several adenomas, history of TVAs removed in 2015.                            Mother had colon cancer dx age 44s. Medicines:                Monitored Anesthesia Care Procedure:                Pre-Anesthesia Assessment:                           - Prior to the procedure, a History and Physical                            was performed, and patient medications and                            allergies were reviewed. The patient's tolerance of                            previous anesthesia was also reviewed. The risks                            and benefits of the procedure and the sedation                            options and risks were discussed with the patient.                            All questions were answered, and informed consent                            was obtained. Prior Anticoagulants: The patient has                            taken no anticoagulant or antiplatelet agents. ASA                            Grade Assessment: II - A patient with mild systemic                            disease. After reviewing the risks and benefits,  the patient was deemed in satisfactory condition to                            undergo the procedure.                           After obtaining informed consent, the colonoscope                            was passed under direct vision. Throughout the                            procedure, the patient's blood pressure, pulse, and                            oxygen  saturations were  monitored continuously. The                            CF [id number] #[id number] was introduced through the anus                            and advanced to the the cecum, identified by                            appendiceal orifice and ileocecal valve. The                            colonoscopy was performed without difficulty. The                            patient tolerated the procedure well. The quality                            of the bowel preparation was adequate. The                            ileocecal valve, appendiceal orifice, and rectum                            were photographed. Scope In: 9:09:23 AM Scope Out: 9:27:44 AM Scope Withdrawal Time: 0 hours 13 minutes 44 seconds  Total Procedure Duration: 0 hours 18 minutes 21 seconds  Findings:                 The perianal and digital rectal examinations were                            normal.                           A diminutive polyp was found in the ascending                            colon. The polyp was sessile. The polyp was removed  with a cold snare. Resection and retrieval were                            complete.                           A 5 mm polyp was found in the hepatic flexure. The                            polyp was sessile. The polyp was removed with a                            cold snare. Resection and retrieval were complete.                           A diminutive polyp was found in the transverse                            colon. The polyp was sessile. The polyp was removed                            with a cold snare. Resection and retrieval were                            complete.                           A 3 mm polyp was found in the descending colon. The                            polyp was sessile. The polyp was removed with a                            cold snare. Resection and retrieval were complete.                           Multiple small-mouthed diverticula were found  in                            the sigmoid colon.                           Internal hemorrhoids were found during                            retroflexion. The hemorrhoids were small.                           The exam was otherwise without abnormality. Complications:            No immediate complications. Estimated blood loss:                            Minimal. Estimated Blood Loss:     Estimated blood loss was minimal. Impression:               -  One diminutive polyp in the ascending colon,                            removed with a cold snare. Resected and retrieved.                           - One 5 mm polyp at the hepatic flexure, removed                            with a cold snare. Resected and retrieved.                           - One diminutive polyp in the transverse colon,                            removed with a cold snare. Resected and retrieved.                           - One 3 mm polyp in the descending colon, removed                            with a cold snare. Resected and retrieved.                           - Diverticulosis in the sigmoid colon.                           - Internal hemorrhoids.                           - The examination was otherwise normal. Recommendation:           - Patient has a contact number available for                            emergencies. The signs and symptoms of potential                            delayed complications were discussed with the                            patient. Return to normal activities tomorrow.                            Written discharge instructions were provided to the                            patient.                           - Resume previous diet.                           - Continue present medications.                           -  Await pathology results. Elspeth P. Laurieanne Galloway, MD 04/22/2024 9:34:58 AM This report has been signed electronically.

## 2024-04-22 NOTE — Progress Notes (Signed)
 Report to PACU, RN, vss, BBS= Clear.

## 2024-04-22 NOTE — Progress Notes (Signed)
  Gastroenterology History and Physical   Primary Care Physician:  Zollie Lowers, MD   Reason for Procedure:   History of colon polyps  Plan:    colonoscopy     HPI: Dakota Cooke is a 68 y.o. male  here for colonoscopy surveillance - numerous polyps removed in the past. Last exam 05/2020, 7 polyps, most adenomas.    Patient denies any bowel symptoms at this time. Otherwise feels well without any cardiopulmonary symptoms. Mother had colon cancer age dx 42s.    I have discussed risks / benefits of anesthesia and endoscopic procedure with Alm Katz and they wish to proceed with the exams as outlined today.    Past Medical History:  Diagnosis Date   Arthritis    BPH (benign prostatic hyperplasia)    Hyperlipidemia 10/15/2019   Hypertension    Personal history of COVID-19 11/2019    Past Surgical History:  Procedure Laterality Date   BACK SURGERY  1991   lower thoracic spine fusion to L3 level   COLONOSCOPY  2015   multiple TA's   POLYPECTOMY  2015   multiple TA's    Prior to Admission medications   Medication Sig Start Date End Date Taking? Authorizing Provider  aspirin  81 MG EC tablet Take 81 mg by mouth daily. Swallow whole.   Yes [provider]  atorvastatin  (LIPITOR) 10 MG tablet Take 1 tablet by mouth once daily 04/04/24  Yes Stacks, Lowers, MD  losartan  (COZAAR ) 100 MG tablet Take 1 tablet by mouth once daily 02/18/24  Yes Zollie Lowers, MD    Current Outpatient Medications  Medication Sig Dispense Refill   aspirin  81 MG EC tablet Take 81 mg by mouth daily. Swallow whole.     atorvastatin  (LIPITOR) 10 MG tablet Take 1 tablet by mouth once daily 90 tablet 2   losartan  (COZAAR ) 100 MG tablet Take 1 tablet by mouth once daily 90 tablet 0   Current Facility-Administered Medications  Medication Dose Route Frequency Provider Last Rate Last Admin   0.9 %  sodium chloride  infusion  500 mL Intravenous Once Bernadetta Roell, Elspeth SQUIBB, MD         Allergies as of 04/22/2024   (No Known Allergies)    Family History  Problem Relation Age of Onset   Hypertension Mother    Kidney cancer Mother    Colon cancer Mother        passed at age 61   Kidney disease Mother        47 when passed from kidney cancer   Parkinson's disease Father    Breast cancer Sister 59   Colon cancer Maternal Uncle 84   Aneurysm Maternal Grandmother        brain   Hypertension Son    Colon polyps Neg Hx    Rectal cancer Neg Hx    Esophageal cancer Neg Hx    Stomach cancer Neg Hx    Crohn's disease Neg Hx     Social History   Socioeconomic History   Marital status: Married    Spouse name: Not on file   Number of children: 3   Years of education: Not on file   Highest education level: Not on file  Occupational History   Occupation: retired  Tobacco Use   Smoking status: Former    Current packs/day: 0.00    Types: Cigarettes    Quit date: 1989    Years since quitting: 36.5   Smokeless tobacco: Never  Vaping Use  Vaping status: Never Used  Substance and Sexual Activity   Alcohol use: Yes    Alcohol/week: 1.0 - 2.0 standard drink of alcohol    Types: 1 - 2 Cans of beer per week    Comment: occassionally   Drug use: Never   Sexual activity: Not on file  Other Topics Concern   Not on file  Social History Narrative   Children live out of town   He lives in one level home with wife and cares for her as she is disabled.    Social Drivers of Corporate investment banker Strain: Low Risk  (02/06/2024)   Overall Financial Resource Strain (CARDIA)    Difficulty of Paying Living Expenses: Not hard at all  Food Insecurity: No Food Insecurity (02/06/2024)   Hunger Vital Sign    Worried About Running Out of Food in the Last Year: Never true    Ran Out of Food in the Last Year: Never true  Transportation Needs: No Transportation Needs (02/06/2024)   PRAPARE - Administrator, Civil Service (Medical): No    Lack of Transportation  (Non-Medical): No  Physical Activity: Sufficiently Active (02/06/2024)   Exercise Vital Sign    Days of Exercise per Week: 7 days    Minutes of Exercise per Session: 30 min  Stress: No Stress Concern Present (02/06/2024)   [employer] of Occupational Health - Occupational Stress Questionnaire    Feeling of Stress : Not at all  Social Connections: Socially Isolated (02/06/2024)   Social Connection and Isolation Panel    Frequency of Communication with Friends and Family: Once a week    Frequency of Social Gatherings with Friends and Family: Once a week    Attends Religious Services: Never    Database administrator or Organizations: No    Attends Banker Meetings: Never    Marital Status: Married  Catering manager Violence: Not At Risk (02/06/2024)   Humiliation, Afraid, Rape, and Kick questionnaire    Fear of Current or Ex-Partner: No    Emotionally Abused: No    Physically Abused: No    Sexually Abused: No    Review of Systems: All other review of systems negative except as mentioned in the HPI.  Physical Exam: Vital signs BP 130/83   Pulse 66   Temp 98.3 F (36.8 C) (Temporal)   Ht 5' 8 (1.727 m)   Wt 210 lb (95.3 kg)   SpO2 96%   BMI 31.93 kg/m   General:   Alert,  Well-developed, pleasant and cooperative in NAD Lungs:  Clear throughout to auscultation.   Heart:  Regular rate and rhythm Abdomen:  Soft, nontender and nondistended.   Neuro/Psych:  Alert and cooperative. Normal mood and affect. A and O x 3  Marcey Naval, MD [facility]

## 2024-04-23 ENCOUNTER — Telehealth: Payer: Self-pay

## 2024-04-23 NOTE — Telephone Encounter (Signed)
  Follow up Call-     04/22/2024    8:23 AM  Call back number  Post procedure Call Back phone  # [phone]  Permission to leave phone message Yes     Patient questions:  Do you have a fever, pain , or abdominal swelling? No. Pain Score  0 *  Have you tolerated food without any problems? Yes.    Have you been able to return to your normal activities? Yes.    Do you have any questions about your discharge instructions: Diet   No. Medications  No. Follow up visit  No.  Do you have questions or concerns about your Care? No.  Actions: * If pain score is 4 or above: No action needed, pain <4.

## 2024-04-25 LAB — SURGICAL PATHOLOGY

## 2024-04-26 ENCOUNTER — Ambulatory Visit: Payer: Self-pay | Admitting: Gastroenterology

## 2024-05-18 ENCOUNTER — Other Ambulatory Visit: Payer: Self-pay | Admitting: Family Medicine

## 2024-05-18 DIAGNOSIS — I10 Essential (primary) hypertension: Secondary | ICD-10-CM

## 2024-06-17 DIAGNOSIS — H2513 Age-related nuclear cataract, bilateral: Secondary | ICD-10-CM | POA: Diagnosis not present

## 2024-06-17 DIAGNOSIS — H524 Presbyopia: Secondary | ICD-10-CM | POA: Diagnosis not present

## 2024-06-17 DIAGNOSIS — H52223 Regular astigmatism, bilateral: Secondary | ICD-10-CM | POA: Diagnosis not present

## 2024-06-17 DIAGNOSIS — H5203 Hypermetropia, bilateral: Secondary | ICD-10-CM | POA: Diagnosis not present

## 2024-06-17 DIAGNOSIS — D3132 Benign neoplasm of left choroid: Secondary | ICD-10-CM | POA: Diagnosis not present

## 2025-01-29 ENCOUNTER — Encounter: Payer: Self-pay | Admitting: Family Medicine
